# Patient Record
Sex: Female | Born: 2000 | Race: Black or African American | Hispanic: No | Marital: Single | Smoking: Former smoker
Health system: Southern US, Community
[De-identification: ages and names within clinical notes are randomized; demographics above are authoritative.]

## PROBLEM LIST (undated history)

## (undated) ENCOUNTER — Inpatient Hospital Stay (HOSPITAL_COMMUNITY): Payer: Self-pay

## (undated) DIAGNOSIS — Z789 Other specified health status: Secondary | ICD-10-CM

## (undated) DIAGNOSIS — D563 Thalassemia minor: Secondary | ICD-10-CM

## (undated) DIAGNOSIS — D573 Sickle-cell trait: Secondary | ICD-10-CM

## (undated) HISTORY — PX: NO PAST SURGERIES: SHX2092

---
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
## 2020-02-08 IMAGING — US US OB COMP LESS 14 WK
1 series · 14 of 28 positions shown · non-contrast
Comparison: None.

CLINICAL DATA: Abdominal pain for 2 and half weeks. Pregnant
patient.

EXAM:
OBSTETRIC <14 WK ULTRASOUND
TECHNIQUE: Transabdominal ultrasound was performed for evaluation of the
gestation as well as the maternal uterus and adnexal regions.

[Series 1: us ob comp less 14 wk · 14 of 53 slices shown]
[im 2/53]
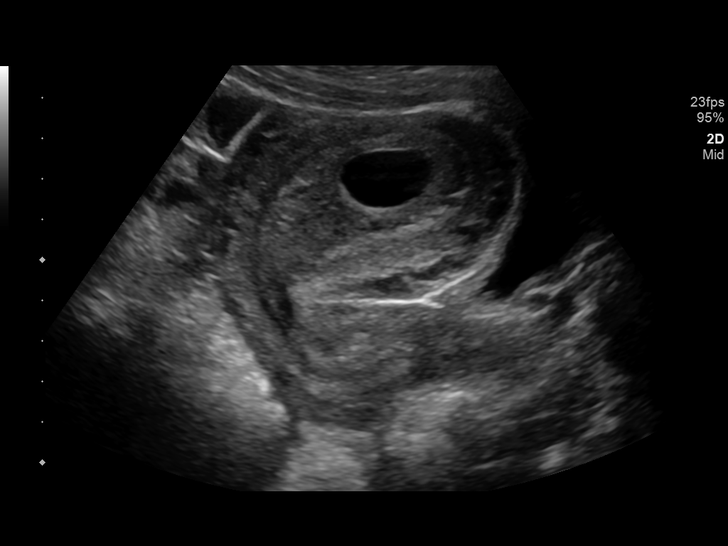
[im 6/53]
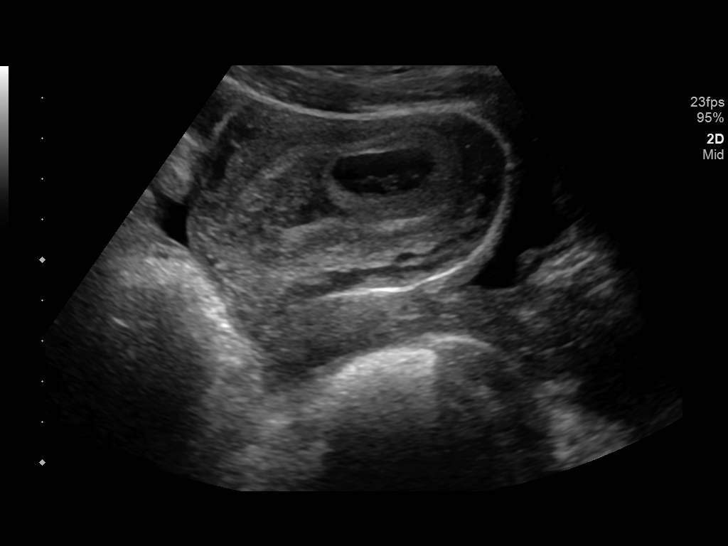
[im 10/53]
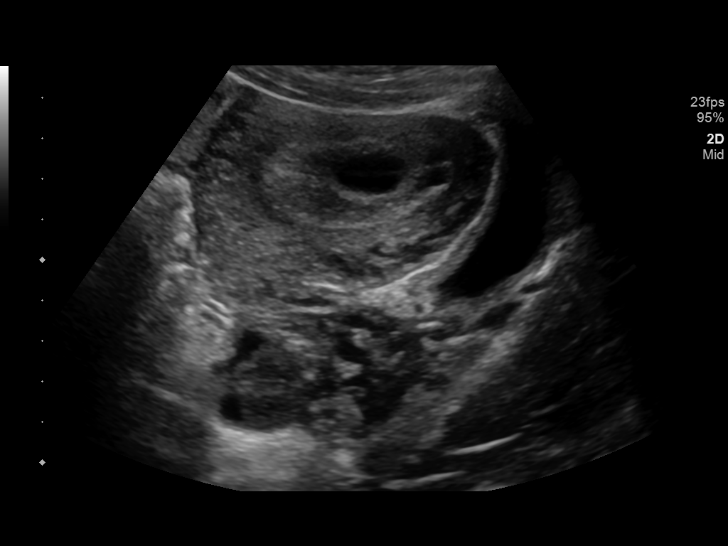
[im 14/53]
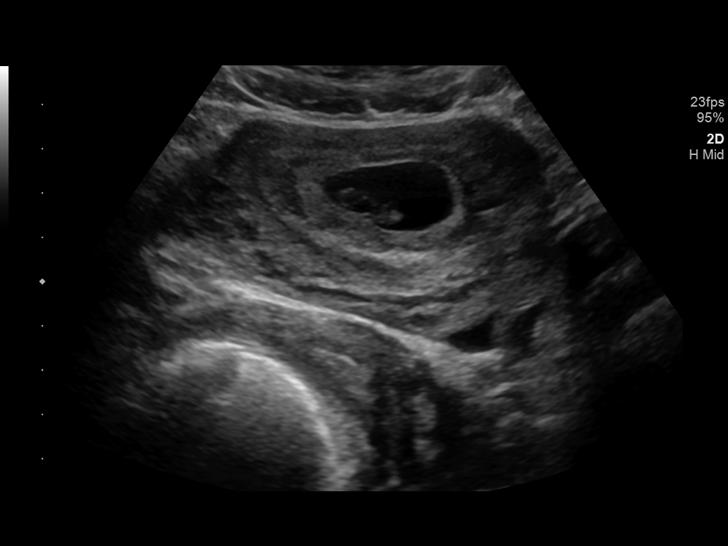
[im 18/53]
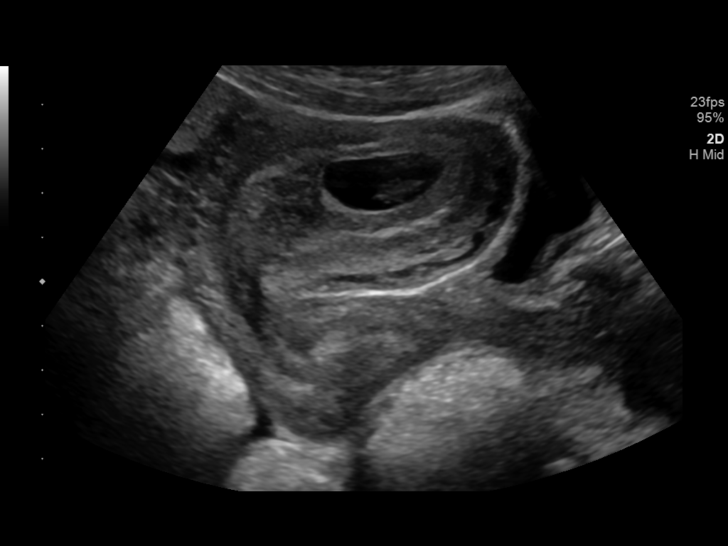
[im 22/53]
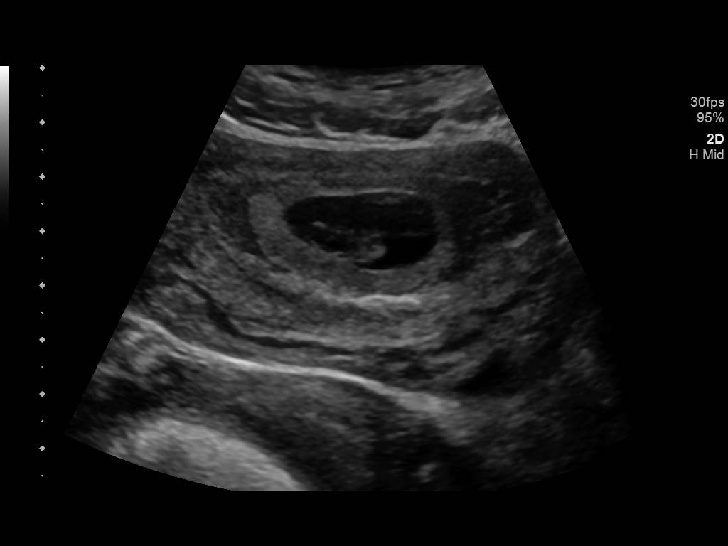
[im 26/53]
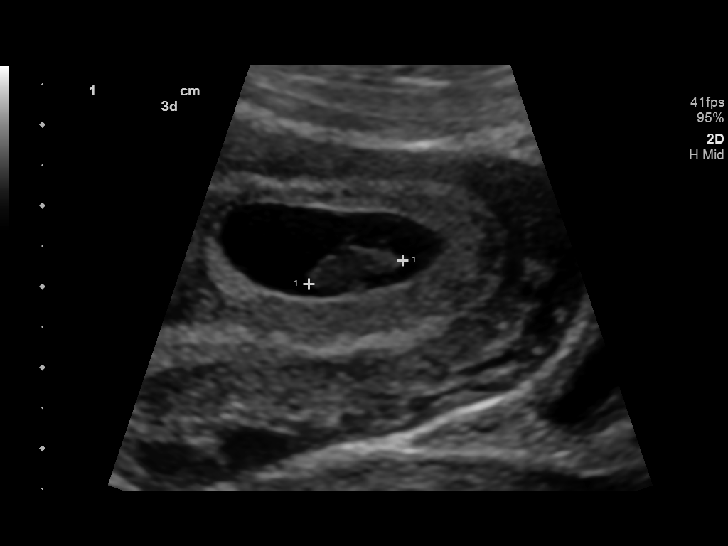
[im 29/53]
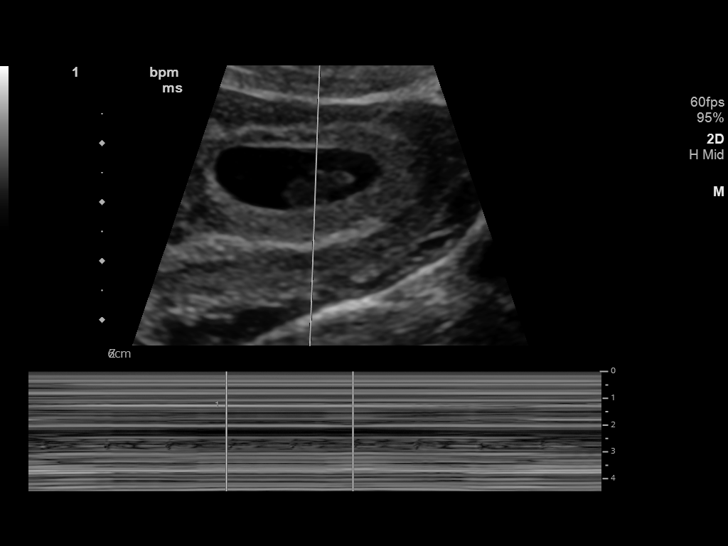
[im 33/53]
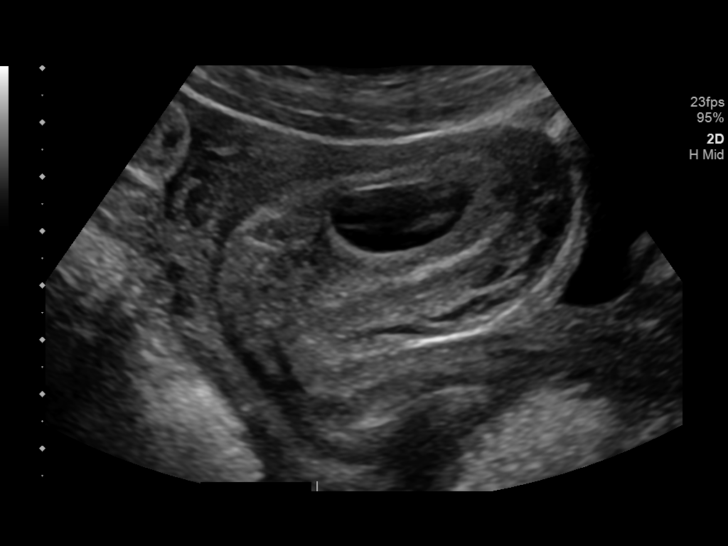
[im 37/53]
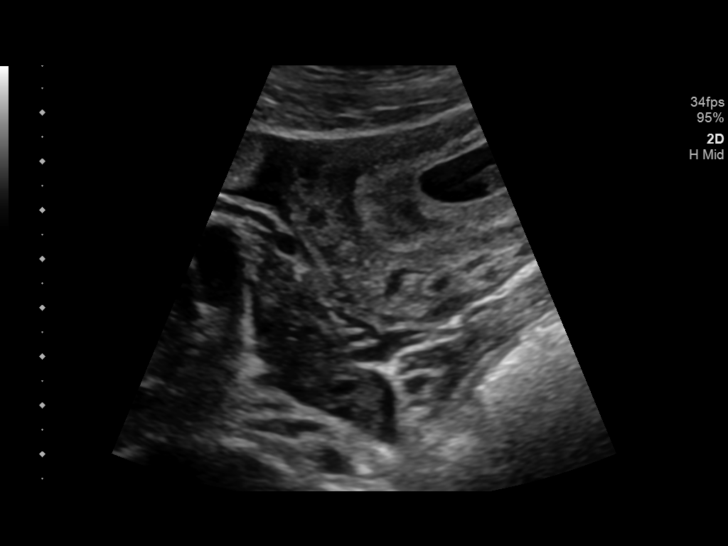
[im 41/53]
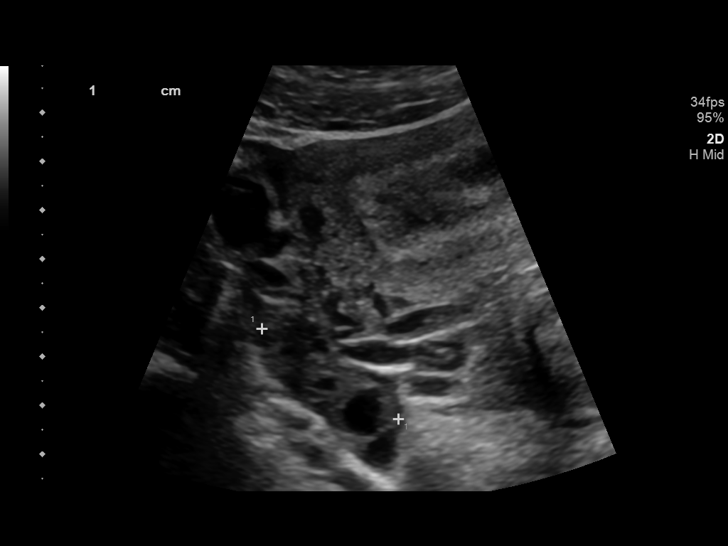
[im 45/53]
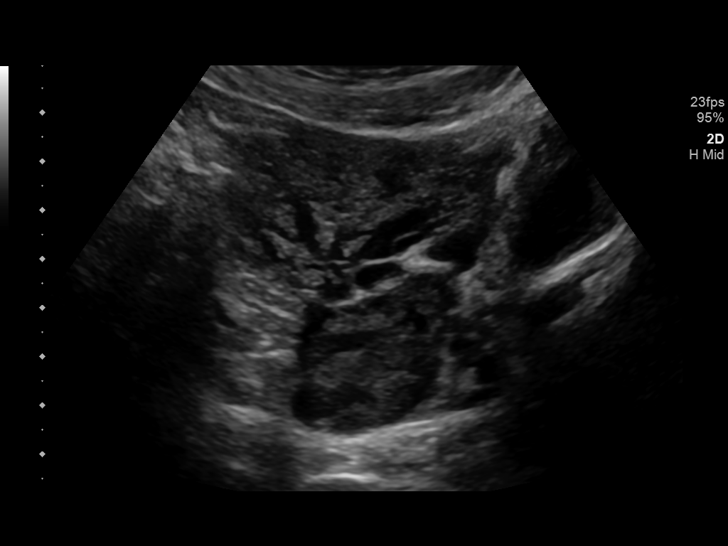
[im 49/53]
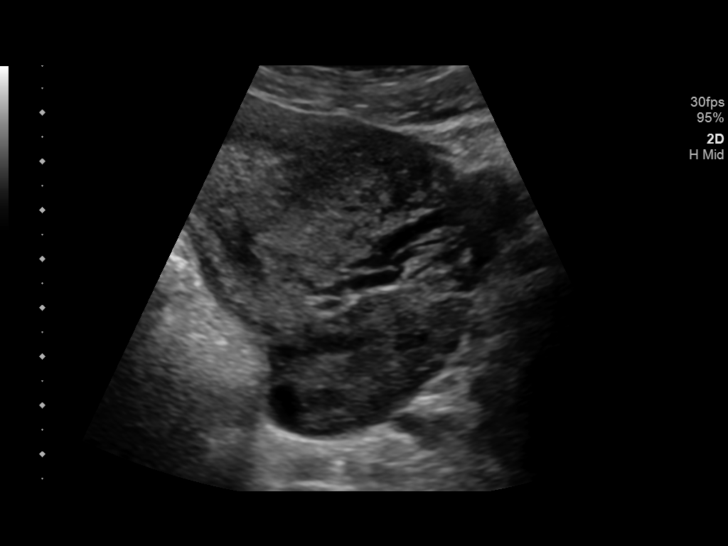
[im 53/53]
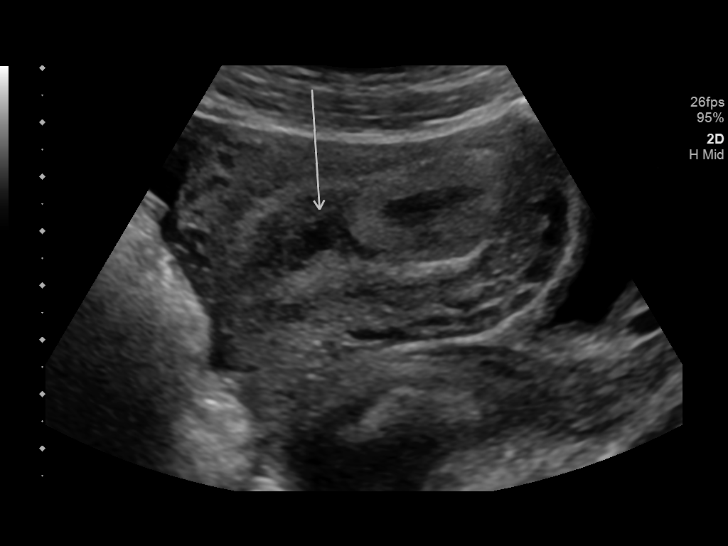

[14 of 28 positions shown; findings below may reference images not displayed]

FINDINGS: Intrauterine gestational sac: Single

Yolk sac:  Visualized.

Embryo:  Visualized.

Cardiac Activity: Visualized.

Heart Rate: 163 bpm

MSD:    mm    w     d

CRL:   12.4 mm   7 w 3 d                  US EDC: March 28, 2019

Subchorionic hemorrhage:  There is a small subchorionic hemorrhage.

Maternal uterus/adnexae: The ovaries are normal in appearance. A
small amount of physiologic fluid is seen in the pelvis.
IMPRESSION: 1. Single live IUP.  Small subchorionic hemorrhage.

## 2020-02-17 ENCOUNTER — Other Ambulatory Visit: Payer: Self-pay

## 2020-02-17 ENCOUNTER — Encounter (HOSPITAL_COMMUNITY): Payer: Self-pay | Admitting: *Deleted

## 2020-02-17 ENCOUNTER — Emergency Department (HOSPITAL_COMMUNITY)
Admission: EM | Admit: 2020-02-17 | Discharge: 2020-02-18 | Disposition: A | Discharge status: Home / Self Care | Payer: Medicaid Other | Attending: Emergency Medicine | Admitting: Emergency Medicine

## 2020-02-17 DIAGNOSIS — A599 Trichomoniasis, unspecified: Secondary | ICD-10-CM | POA: Diagnosis not present

## 2020-02-17 DIAGNOSIS — Z87891 Personal history of nicotine dependence: Secondary | ICD-10-CM | POA: Diagnosis not present

## 2020-02-17 DIAGNOSIS — N898 Other specified noninflammatory disorders of vagina: Secondary | ICD-10-CM | POA: Diagnosis present

## 2020-02-17 LAB — WET PREP, GENITAL
Clue Cells Wet Prep HPF POC: NONE SEEN
Sperm: NONE SEEN
WBC, Wet Prep HPF POC: NONE SEEN
Yeast Wet Prep HPF POC: NONE SEEN

## 2020-02-17 LAB — URINALYSIS, ROUTINE W REFLEX MICROSCOPIC
Bilirubin Urine: NEGATIVE
Glucose, UA: NEGATIVE mg/dL
Hgb urine dipstick: NEGATIVE
Ketones, ur: NEGATIVE mg/dL
Nitrite: NEGATIVE
Protein, ur: NEGATIVE mg/dL
Specific Gravity, Urine: 1.024 (ref 1.005–1.030)
WBC, UA: >50 WBC/hpf — ABNORMAL HIGH (ref 0–5)
pH: 5 (ref 5.0–8.0)

## 2020-02-17 LAB — POC URINE PREG, ED: Preg Test, Ur: NEGATIVE

## 2020-02-17 MED ORDER — METRONIDAZOLE 500 MG PO TABS: 500.0000 mg | ORAL_TABLET | Freq: Two times a day (BID) | ORAL | 0 refills | Status: DC

## 2020-02-17 MED ORDER — METRONIDAZOLE 500 MG PO TABS
500.0000 mg | ORAL_TABLET | Freq: Once | ORAL | Status: AC
  Administered 2020-02-18: 500 mg via ORAL
  Filled 2020-02-17: qty 1

## 2020-02-17 NOTE — ED Provider Notes (Signed)
[facility]-EMERGENCY DEPT Provider Note   CSN: [id number] Arrival date & time: 02/17/20  1943     History Chief Complaint  Patient presents with  . Pelvic Pain    Cathy Weber is a G49P1 19 y.o. female with a history of sickle cell trait who presents the emergency department with a chief complaint of vaginal discharge.  The patient had an uncomplicated vaginal delivery on 03/30/19.  She reports that she developed sharp intermittent pelvic pain that would travels from left to right shortly after giving birth.  Pain would last for approximately 30 minutes at a time.  She has had countless episodes of the pain since giving birth.  Pain does not radiate to her back or to her chest.  No known aggravating or alleviating factors.  She did not discuss the pain with her OB/GYN after delivery as she was still having vaginal bleeding and thought the pain was related to giving birth.  She reports that recurrent episodes of the pain have persisted for the last 10 months, but over the last month she began having malodorous vaginal discharge, yellowish/orange in color. Her ER visit today was prompted by an increase in vaginal discharge over the last few days and concerns for infection after she searched for her symptoms on the internet.   She denies fever, chills, abdominal pain, dysuria, hematuria, flank pain, rashes, urinary frequency or hesitancy, nausea, vomiting, diarrhea, or URI symptoms.  She is sexually active with 1 female partner.  They do not use condoms.  She is not on any form of birth control.  She is not breast-feeding at this time.  She has no concerns for STIs.  The history is provided by the patient. No language interpreter was used.       Past Medical History:  Diagnosis Date  . Alpha thalassemia silent carrier 11/19/2018  . Medical history non-contributory   . Sickle cell trait (HCC) 11/19/2018    Patient Active Problem List   Diagnosis Date Noted  .  Sickle cell trait (HCC) 11/19/2018  . Alpha thalassemia silent carrier 11/19/2018    Past Surgical History:  Procedure Laterality Date  . NO PAST SURGERIES       OB History    Gravida  1   Para  1   Term  1   Preterm      AB      Living  1     SAB      TAB      Ectopic      Multiple  0   Live Births  1           Family History  Problem Relation Age of Onset  . Healthy Mother   . Healthy Father     Social History   Tobacco Use  . Smoking status: Former Games developer  . Smokeless tobacco: Never Used  Vaping Use  . Vaping Use: Never used  Substance Use Topics  . Alcohol use: Not Currently  . Drug use: Not Currently    Types: Marijuana    Home Medications Prior to Admission medications   Medication Sig Start Date End Date Taking? Authorizing Provider  metroNIDAZOLE (FLAGYL) 500 MG tablet Take 1 tablet (500 mg total) by mouth 2 (two) times daily. 02/17/20   Atilla Zollner A, PA-C  Blood Pressure Monitoring (BLOOD PRESSURE CUFF) MISC 1 Device by Does not apply route once a week. For weekly monitoring of blood pressure Patient not taking: Reported on 05/04/2019  11/06/18 02/17/20  Tereso Newcomer, MD    Allergies    Patient has no known allergies.  Review of Systems   Review of Systems  Constitutional: Negative for activity change, chills and fever.  HENT: Negative for congestion and sore throat.   Respiratory: Negative for shortness of breath.   Cardiovascular: Negative for chest pain.  Gastrointestinal: Negative for abdominal pain, diarrhea, nausea and vomiting.  Genitourinary: Positive for pelvic pain and vaginal discharge. Negative for decreased urine volume, dysuria, frequency, genital sores, hematuria, vaginal bleeding and vaginal pain.  Musculoskeletal: Negative for back pain.  Skin: Negative for rash and wound.  Allergic/Immunologic: Negative for immunocompromised state.  Neurological: Negative for seizures, syncope, weakness and headaches.    Psychiatric/Behavioral: Negative for confusion.    Physical Exam Updated Vital Signs BP 118/77 (BP Location: Right Arm)   Pulse 85   Temp 97.9 F (36.6 C) (Oral)   Resp 18   Ht 5\' 10"  (1.778 m)   Wt 79.4 kg   LMP 01/30/2020   SpO2 100%   BMI 25.12 kg/m   Physical Exam Vitals and nursing note reviewed.  Constitutional:      General: She is not in acute distress.    Appearance: She is not ill-appearing, toxic-appearing or diaphoretic.     Comments: Nontoxic-appearing.  No acute distress.  HENT:     Head: Normocephalic.  Eyes:     Conjunctiva/sclera: Conjunctivae normal.  Cardiovascular:     Rate and Rhythm: Normal rate and regular rhythm.     Heart sounds: No murmur heard.  No friction rub. No gallop.   Pulmonary:     Effort: Pulmonary effort is normal. No respiratory distress.     Breath sounds: No stridor. No wheezing, rhonchi or rales.  Chest:     Chest wall: No tenderness.  Abdominal:     General: There is no distension.     Palpations: Abdomen is soft. There is no mass.     Tenderness: There is abdominal tenderness. There is no right CVA tenderness, left CVA tenderness, guarding or rebound.     Hernia: No hernia is present.     Comments: Abdomen is soft, nontender, nondistended.  Normoactive bowel sounds.  Minimal tenderness palpation in the bilateral pelvic regions without focal tenderness.  No rebound or guarding.  No tenderness over McBurney's point.  No CVA tenderness bilaterally.  Negative Murphy sign.  Genitourinary:    Comments: Chaperoned exam.  Scant yellow discharge in the vaginal vault.  Cervix is closed and she has no cervical motion tenderness.  Minimal tenderness to palpation to the right adnexa, but no masses.  Left adnexa is unremarkable. Musculoskeletal:     Cervical back: Neck supple.  Skin:    General: Skin is warm.     Findings: No rash.  Neurological:     Mental Status: She is alert.  Psychiatric:        Behavior: Behavior normal.      ED Results / Procedures / Treatments   Labs (all labs ordered are listed, but only abnormal results are displayed) Labs Reviewed  WET PREP, GENITAL - Abnormal; Notable for the following components:      Result Value   Trich, Wet Prep PRESENT (*)    All other components within normal limits  URINALYSIS, ROUTINE W REFLEX MICROSCOPIC - Abnormal; Notable for the following components:   Leukocytes,Ua LARGE (*)    WBC, UA >50 (*)    Bacteria, UA RARE (*)    All  other components within normal limits  RPR  HIV ANTIBODY (ROUTINE TESTING W REFLEX)  POC URINE PREG, ED  GC/CHLAMYDIA PROBE AMP ([place]) NOT AT [facility]    EKG None  Radiology No results found.  Procedures Procedures (including critical care time)  Medications Ordered in ED Medications  metroNIDAZOLE (FLAGYL) tablet 500 mg (500 mg Oral Given 02/18/20 0003)    ED Course  I have reviewed the triage vital signs and the nursing notes.  Pertinent labs & imaging results that were available during my care of the patient were reviewed by me and considered in my medical decision making (see chart for details).    MDM Rules/Calculators/A&P                          19 year old female with a history of sickle cell trait presenting with pelvic pain for the last 10 months and vaginal discharge that has been increasing over the last month.  No constitutional symptoms.  No urinary complaints.  Vital signs are normal.  She is nontoxic and well-appearing.  Abdominal exam is benign.  Pelvic exam is not concerning for PID.   UA with pyuria and leukocyte esterase.  I have a low suspicion for UTI given that she has no nitrates and given her symptoms, I suspect that this is related to vaginal infection.  Wet prep is positive for trichomonas.  Patient is aware that gonorrhea and Chlamydia testing is pending.  After additional education on trichomonas infection, patient is requesting testing for HIV and syphilis, which has been  ordered.  She is aware these test are also pending.  She has declined prophylactic treatment for gonorrhea and chlamydia today.  She is aware that if these tests are positive she will need to return for treatment.  She was also counseled on instructing all sexual partners that they will need to seek testing and treatment.  Outpatient resources given.  Although patient had minimal right adnexal tenderness, I have a low suspicion for TOA at this time given that patient is well-appearing.  Pelvic ultrasound is not indicated at this time.  I have a low suspicion for appendicitis, ovarian torsion, pyelonephritis, or diverticulitis.  I have advised the patient to follow-up with OB/GYN if pelvic pain does not resolve after she has completed antibiotics.  All questions answered.  ER return precautions given.  She is hemodynamically stable to no acute distress.  Safe for discharge home with outpatient follow-up as indicated.  Final Clinical Impression(s) / ED Diagnoses Final diagnoses:  Trichomoniasis    Rx / DC Orders ED Discharge Orders         Ordered    metroNIDAZOLE (FLAGYL) 500 MG tablet  2 times daily        02/17/20 2348           Anyra Kaufman A, PA-C 02/18/20 0011    Mancel Bale, MD 02/18/20 1735

## 2020-02-17 NOTE — Discharge Instructions (Signed)
Thank you for allowing me to provide your care today in the emergency department.  You were seen today for pelvic pain and vaginal discharge. You tested positive for trichomonas. This is an infection that is an STD and is transmitted through sexual intercourse.   This infection is treated with an antibiotic called metronidazole (Flagyl).  Your first dose was given tonight in the emergency department.  Take 1 tablet of metronidazole by mouth morning and night for the next week.  It is important that you complete the entire course of antibiotics.  You should also avoid alcohol while you are taking this antibiotic as it can cause severe vomiting as a side effect of the medication.  Your gonorrhea and chlamydia tests are pending.  Your syphilis and HIV test are also pending.   If any of these tests are positive, someone from the hospital will call you at the number you provided registration.  You can also download the my chart application and use the number on your discharge paperwork to register for an account.  Lab results are typically available in the app 72 hours after they have resulted.  To prevent STDs in the future, it is important that you use a condom every time you have sex.  Please remember, if you start taking birth control in the future, this can prevent pregnancy, but does not prevent sexually transmitted diseases (STDs).  [facility] recommends avoiding all sexual activities for a total of 14 days after you start treatment.      It is your responsibility to let all sexual partners know that you tested positive for trichomonas so that they can seek testing at treatment.   It is also important to note that if you were treated and have sex with someone that has not been treated that you can be reinfected.  If your pelvic pain does not resolve after you have finished treatment, please follow-up with your OB/GYN.  You can go to the [facility] department for  repeat STD testing.  You can make an appointment through their website.  Return to the emergency department if you develop significantly worsening symptoms such as fever, chills, severe pain, redness, or swelling to the penis or testicles, or if the discharge from your penis does not improve.

## 2020-02-17 NOTE — ED Triage Notes (Signed)
Pt states she has had pelvic pain since October when she had her baby. States she is also having yellow/orange discharge for about a month.

## 2020-02-18 LAB — RPR: RPR Ser Ql: NONREACTIVE

## 2020-02-18 LAB — GC/CHLAMYDIA PROBE AMP (~~LOC~~) NOT AT ARMC
Chlamydia: NEGATIVE
Comment: NEGATIVE
Comment: NORMAL
Neisseria Gonorrhea: NEGATIVE

## 2020-02-18 LAB — HIV ANTIBODY (ROUTINE TESTING W REFLEX): HIV Screen 4th Generation wRfx: NONREACTIVE

## 2020-03-07 ENCOUNTER — Encounter: Payer: Self-pay | Admitting: Radiology

## 2020-04-16 ENCOUNTER — Encounter (HOSPITAL_COMMUNITY): Payer: Self-pay | Admitting: Emergency Medicine

## 2020-04-16 ENCOUNTER — Emergency Department (HOSPITAL_COMMUNITY)
Admission: EM | Admit: 2020-04-16 | Discharge: 2020-04-16 | Disposition: A | Discharge status: Home / Self Care | Payer: Medicaid Other | Attending: Emergency Medicine | Admitting: Emergency Medicine

## 2020-04-16 ENCOUNTER — Other Ambulatory Visit: Payer: Self-pay

## 2020-04-16 DIAGNOSIS — Z87891 Personal history of nicotine dependence: Secondary | ICD-10-CM | POA: Diagnosis not present

## 2020-04-16 DIAGNOSIS — B9689 Other specified bacterial agents as the cause of diseases classified elsewhere: Secondary | ICD-10-CM | POA: Insufficient documentation

## 2020-04-16 DIAGNOSIS — Z202 Contact with and (suspected) exposure to infections with a predominantly sexual mode of transmission: Secondary | ICD-10-CM | POA: Insufficient documentation

## 2020-04-16 DIAGNOSIS — N760 Acute vaginitis: Secondary | ICD-10-CM | POA: Insufficient documentation

## 2020-04-16 LAB — WET PREP, GENITAL
Sperm: NONE SEEN
Trich, Wet Prep: NONE SEEN
Yeast Wet Prep HPF POC: NONE SEEN

## 2020-04-16 LAB — PREGNANCY, URINE: Preg Test, Ur: NEGATIVE

## 2020-04-16 MED ORDER — METRONIDAZOLE 500 MG PO TABS: 500.0000 mg | ORAL_TABLET | Freq: Two times a day (BID) | ORAL | 0 refills | Status: DC

## 2020-04-16 NOTE — ED Provider Notes (Signed)
[facility]   CSN: [id number] Arrival date & time: 04/16/20  1646     History Chief Complaint  Patient presents with  . Exposure to STD    Cathy Weber is a 19 y.o. female.  19 year old female presents to emergency room with request for STD testing.  Patient states that she tested positive for trichomoniasis 2 months ago, states her partner was also treated at that time.  Patient states that she had unprotected intercourse with the same person recently and he noticed blood in his ejaculate which has her concerned for possible STD today. Denies abdominal or pelvic pain, unusual discharge or any other complaints or concerns. Patient denies possibility of pregnancy although does not use any form of birth control.         Past Medical History:  Diagnosis Date  . Alpha thalassemia silent carrier 11/19/2018  . Medical history non-contributory   . Sickle cell trait (HCC) 11/19/2018    Patient Active Problem List   Diagnosis Date Noted  . Sickle cell trait (HCC) 11/19/2018  . Alpha thalassemia silent carrier 11/19/2018    Past Surgical History:  Procedure Laterality Date  . NO PAST SURGERIES       OB History    Gravida  1   Para  1   Term  1   Preterm      AB      Living  1     SAB      TAB      Ectopic      Multiple  0   Live Births  1           Family History  Problem Relation Age of Onset  . Healthy Mother   . Healthy Father     Social History   Tobacco Use  . Smoking status: Former Games developer  . Smokeless tobacco: Never Used  Vaping Use  . Vaping Use: Never used  Substance Use Topics  . Alcohol use: Not Currently  . Drug use: Not Currently    Types: Marijuana    Home Medications Prior to Admission medications   Medication Sig Start Date End Date Taking? Authorizing Provider  metroNIDAZOLE (FLAGYL) 500 MG tablet Take 1 tablet (500 mg total) by mouth 2 (two) times daily. 04/16/20    Jeannie Fend, PA-C  Blood Pressure Monitoring (BLOOD PRESSURE CUFF) MISC 1 Device by Does not apply route once a week. For weekly monitoring of blood pressure Patient not taking: Reported on 05/04/2019 11/06/18 02/17/20  Tereso Newcomer, MD    Allergies    Patient has no known allergies.  Review of Systems   Review of Systems  Constitutional: Negative for fever.  Gastrointestinal: Negative for abdominal pain.  Genitourinary: Negative for pelvic pain, vaginal bleeding and vaginal discharge.  Musculoskeletal: Negative for back pain.  Skin: Negative for rash and wound.  Allergic/Immunologic: Negative for immunocompromised state.    Physical Exam Updated Vital Signs BP 113/64 (BP Location: Left Arm)   Pulse 80   Temp 97.8 F (36.6 C) (Oral)   Resp 16   Ht 5' 10.5" (1.791 m)   Wt 78.9 kg   SpO2 100%   BMI 24.61 kg/m   Physical Exam Vitals and nursing note reviewed.  Constitutional:      General: She is not in acute distress.    Appearance: She is well-developed. She is not diaphoretic.  HENT:     Head: Normocephalic and atraumatic.  Cardiovascular:  Rate and Rhythm: Normal rate and regular rhythm.     Pulses: Normal pulses.     Heart sounds: Normal heart sounds.  Pulmonary:     Effort: Pulmonary effort is normal.     Breath sounds: Normal breath sounds.  Abdominal:     Palpations: Abdomen is soft.     Tenderness: There is no abdominal tenderness. There is no right CVA tenderness or left CVA tenderness.  Skin:    General: Skin is warm and dry.  Neurological:     Mental Status: She is alert and oriented to person, place, and time.  Psychiatric:        Behavior: Behavior normal.     ED Results / Procedures / Treatments   Labs (all labs ordered are listed, but only abnormal results are displayed) Labs Reviewed  WET PREP, GENITAL - Abnormal; Notable for the following components:      Result Value   Clue Cells Wet Prep HPF POC PRESENT (*)    WBC, Wet Prep HPF  POC RARE (*)    All other components within normal limits  PREGNANCY, URINE  GC/CHLAMYDIA PROBE AMP ([place]) NOT AT [facility]    EKG None  Radiology No results found.  Procedures Procedures (including critical care time)  Medications Ordered in ED Medications - No data to display  ED Course  I have reviewed the triage vital signs and the nursing notes.  Pertinent labs & imaging results that were available during my care of the patient were reviewed by me and considered in my medical decision making (see chart for details).  Clinical Course as of Apr 16 1937  Sat Apr 16, 2020  [id number] 19 year old female with request for STD testing, no symptoms or concerns otherwise. Exam unremarkable. Plan is to obtain self swab, sent upreg and urine gc/chlamydia. Recommend follow up with outpatient testing centers (referral list given) for further testing and follow up.    [LM]  1938 Wet prep is positive for clue cells, patient states that the discharge she had when she had trichomoniasis never totally resolved, will treat this with Flagyl. Still recommend follow-up with clinic for additional STD testing as discussed.   [LM]    Clinical Course User Index [LM] Alden Hipp   MDM Rules/Calculators/A&P                          Final Clinical Impression(s) / ED Diagnoses Final diagnoses:  Possible exposure to STD  Bacterial vaginosis    Rx / DC Orders ED Discharge Orders         Ordered    metroNIDAZOLE (FLAGYL) 500 MG tablet  2 times daily        04/16/20 1935           Alden Hipp 04/16/20 1938    Little, Ambrose Finland, MD 04/17/20 1306

## 2020-04-16 NOTE — ED Triage Notes (Addendum)
Patient states she was having unprotected sex and requesting STD check. Denies symptoms at this time. Hx trich.

## 2020-04-16 NOTE — Discharge Instructions (Addendum)
There are many options for quick evaluation and management of certain GYN issues that do not require a long wait time or big bill from the emergency department.   Consider these options for your care in the future:   Walk-ins for certain complaints available at:   [facility] [address]  [phone]  See hours at [email or web address]   [facility]  [address]  [phone]   [facility]  [address]  [phone]   You can make an appointment to see a GYN provider:   [facility]  [address]  [phone]   [facility] [place]  [address]  [phone]   [facility] [place]  [address]  [phone]   [facility] [place]  [address], [address]  [phone]   If you already have an established OB/GYN provider in the area you can make an appointment with them as well.  

## 2020-04-18 LAB — GC/CHLAMYDIA PROBE AMP (~~LOC~~) NOT AT ARMC
Chlamydia: NEGATIVE
Comment: NEGATIVE
Comment: NORMAL
Neisseria Gonorrhea: NEGATIVE

## 2020-05-09 IMAGING — US US MFM OB COMP + 14 WK
1 series · 13 of 28 positions shown · non-contrast
Comparison: none

[Series 1: us mfm ob comp + 14 wk · 95 acquisitions, 13 frames shown]
[im 4/95]
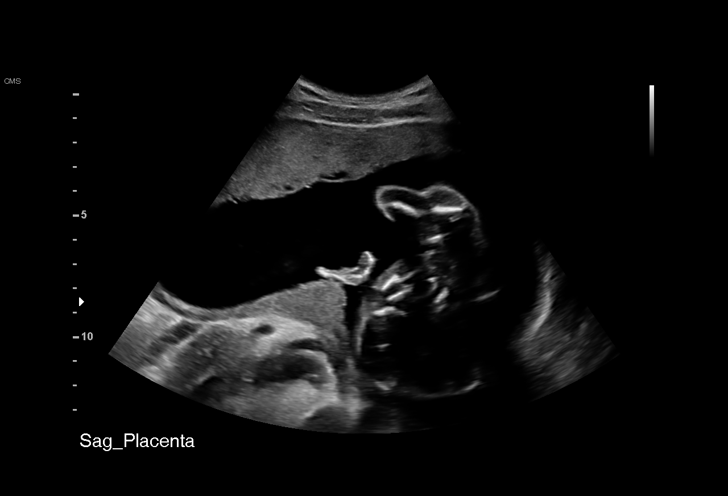
[im 11/95]
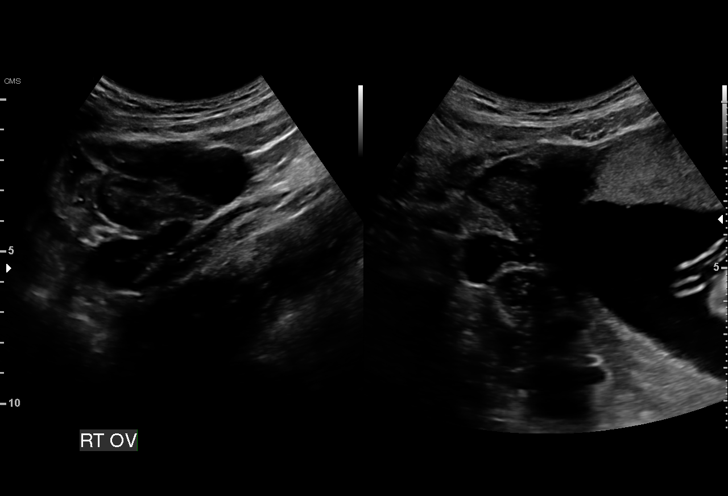
[im 18/95]
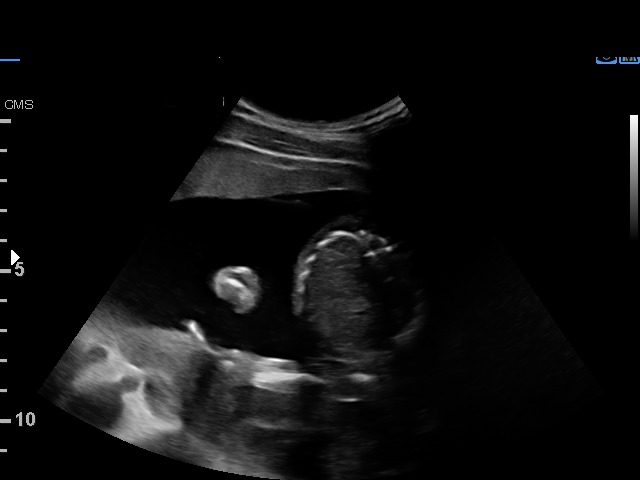
[im 25/95]
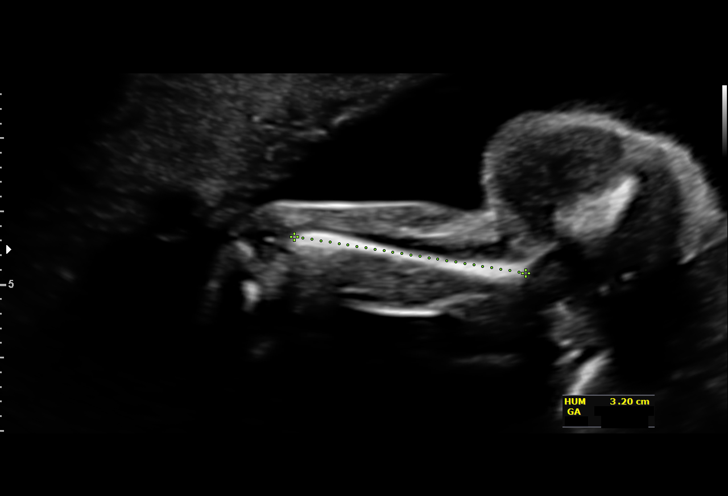
[im 32/95]
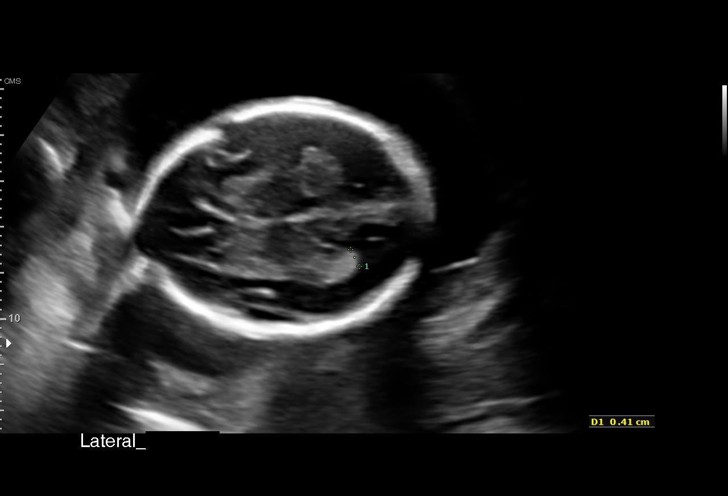
[im 39/95]
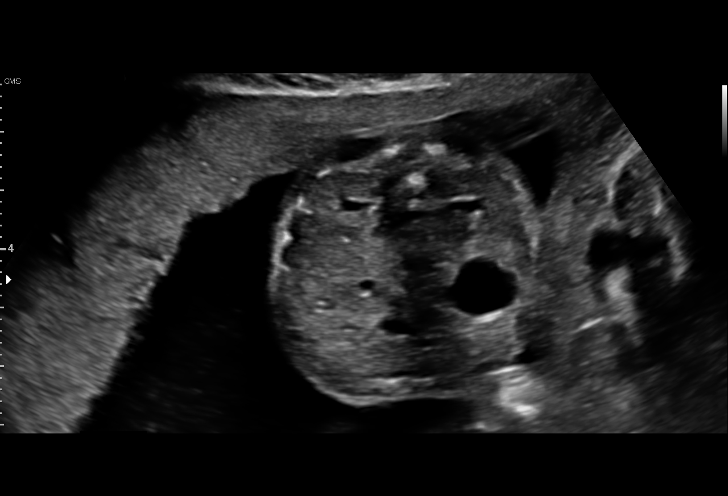
[im 49/95]
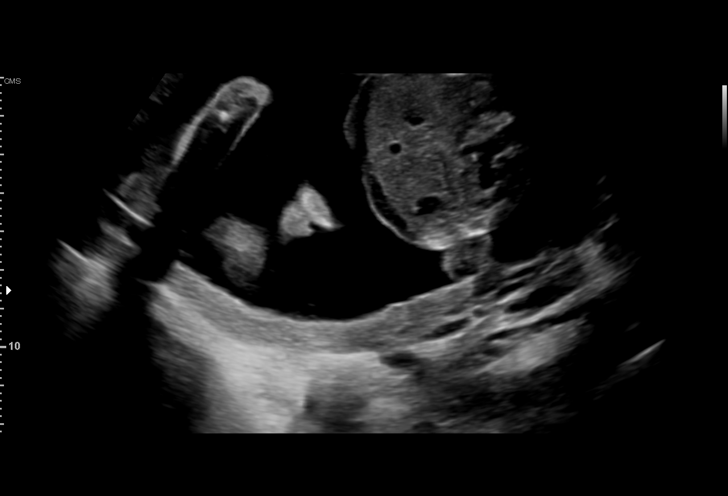
[im 56/95]
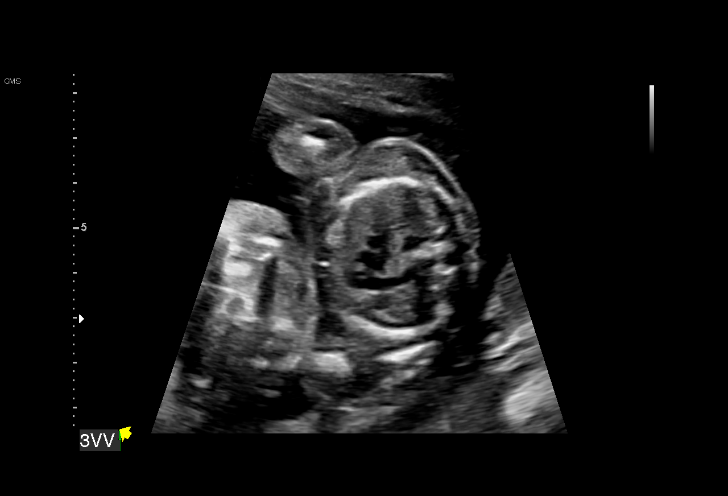
[im 63/95]
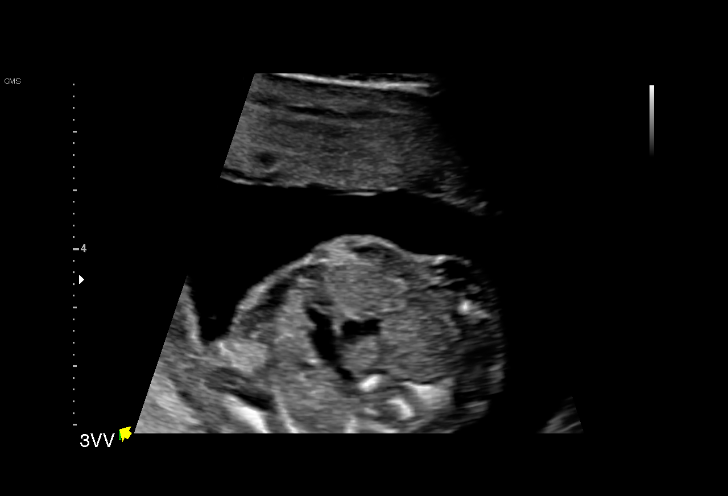
[im 70/95]
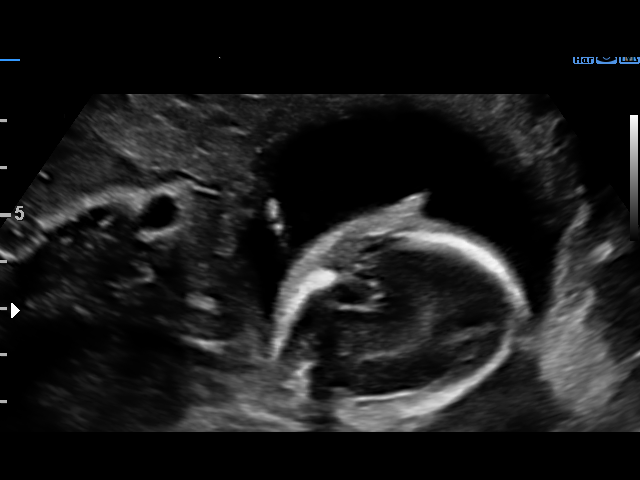
[im 77/95]
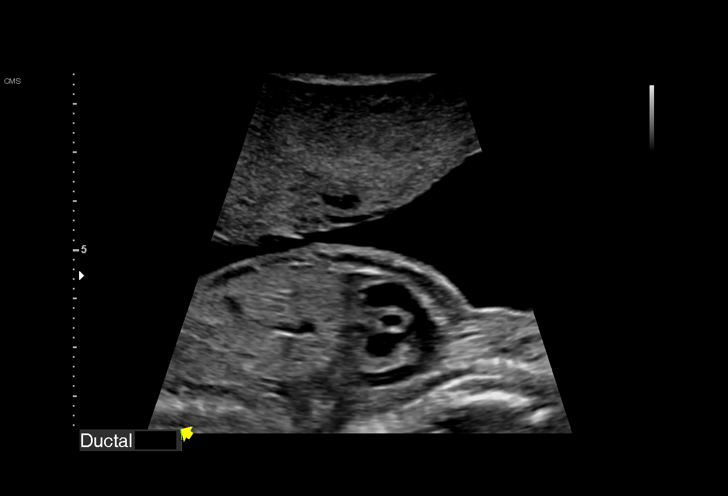
[im 84/95]
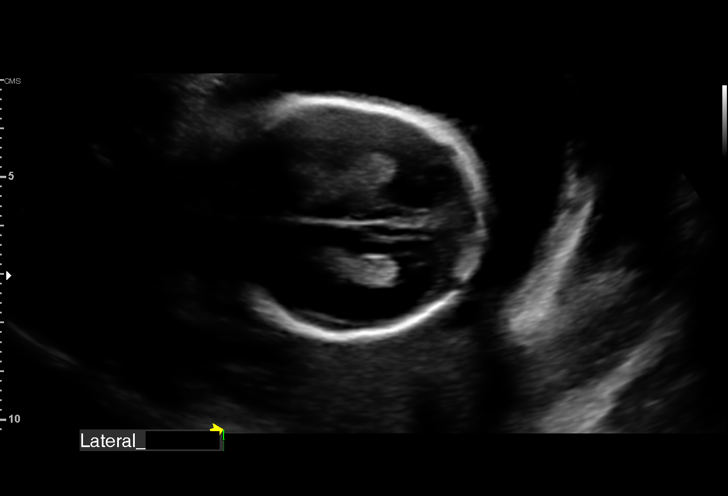
[im 91/95]
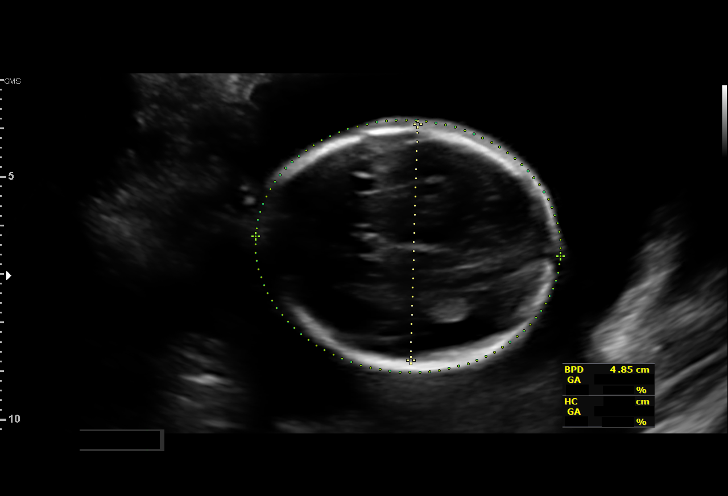

[13 of 28 positions shown; findings below may reference images not displayed]

1  US MFM OB COMP + 14 WK               76805.01     LUIX GIRO
 ----------------------------------------------------------------------

 ----------------------------------------------------------------------
Indications

  Encounter for antenatal screening for
  malformations
  20 weeks gestation of pregnancy
 ----------------------------------------------------------------------
Fetal Evaluation

 Num Of Fetuses:         1
 Fetal Heart Rate(bpm):  158
 Cardiac Activity:       Observed
 Presentation:           Variable
 Placenta:               Anterior
 P. Cord Insertion:      Visualized

 Amniotic Fluid
 AFI FV:      Within normal limits

                             Largest Pocket(cm)

Biometry

 BPD:      48.5  mm     G. Age:  20w 5d         60  %    CI:        72.76   %    70 - 86
                                                         FL/HC:      17.5   %    16.8 -
 HC:      180.8  mm     G. Age:  20w 3d         45  %    HC/AC:      1.22        1.09 -
 AC:      148.5  mm     G. Age:  20w 1d         34  %    FL/BPD:     65.2   %
 FL:       31.6  mm     G. Age:  19w 6d         22  %    FL/AC:      21.3   %    20 - 24
 HUM:      32.3  mm     G. Age:  20w 6d         61  %
 CER:      20.7  mm     G. Age:  19w 5d         34  %

 CM:        5.2  mm

 Est. FW:     329  gm    0 lb 12 oz      39  %
OB History
 Gravidity:    1         Term:   0        Prem:   0        SAB:   0
 TOP:          0       Ectopic:  0        Living: 0
Gestational Age

 LMP:           21w 6d        Date:  06/11/18                 EDD:   03/18/19
 U/S Today:     20w 2d                                        EDD:   03/29/19
 Best:          20w 3d     Det. By:  Early Ultrasound         EDD:   03/28/19
                                     (08/12/18)
Anatomy

 Cranium:               Appears normal         Aortic Arch:            Appears normal
 Cavum:                 Appears normal         Ductal Arch:            Appears normal
 Ventricles:            Appears normal         Diaphragm:              Appears normal
 Choroid Plexus:        Appears normal         Stomach:                Appears normal, left
                                                                       sided
 Cerebellum:            Appears normal         Abdomen:                Appears normal
 Posterior Fossa:       Appears normal         Abdominal Wall:         Appears nml (cord
                                                                       insert, abd wall)
 Nuchal Fold:           Appears normal         Cord Vessels:           Appears normal (3
                                                                       vessel cord)
 Face:                  Appears normal         Kidneys:                Appear normal
                        (orbits and profile)
 Lips:                  Appears normal         Bladder:                Appears normal
 Thoracic:              Appears normal         Spine:                  Appears normal
 Heart:                 Appears normal         Upper Extremities:      Appears normal
                        (4CH, axis, and
                        situs)
 RVOT:                  Appears normal         Lower Extremities:      Appears normal
 LVOT:                  Appears normal

 Other:  Heels and 5th digit visualized. Nasal bone visualized.
Cervix Uterus Adnexa

 Cervix
 Length:            3.4  cm.
 Normal appearance by transabdominal scan.

 Uterus
 No abnormality visualized.

 Left Ovary
 Not visualized.

 Right Ovary
 Within normal limits.

 Cul De Sac
 No free fluid seen.

 Adnexa
 No abnormality visualized.
Impression

 We performed fetal anatomy scan. No makers of
 aneuploidies or fetal structural defects are seen. Fetal
 biometry is consistent with her previously-established dates.
 Amniotic fluid is normal and good fetal activity is seen.
 Cell-free fetal DNA screening result is pending.
Recommendations

 Follow-up as clinically indicated.
                 Erxleben, [place]

## 2020-05-18 ENCOUNTER — Emergency Department (HOSPITAL_COMMUNITY)
Admission: EM | Admit: 2020-05-18 | Discharge: 2020-05-18 | Disposition: A | Discharge status: Home / Self Care | Payer: Medicaid Other | Attending: Emergency Medicine | Admitting: Emergency Medicine

## 2020-05-18 ENCOUNTER — Encounter (HOSPITAL_COMMUNITY): Payer: Self-pay

## 2020-05-18 DIAGNOSIS — N3000 Acute cystitis without hematuria: Secondary | ICD-10-CM | POA: Diagnosis not present

## 2020-05-18 DIAGNOSIS — R102 Pelvic and perineal pain: Secondary | ICD-10-CM | POA: Diagnosis present

## 2020-05-18 DIAGNOSIS — Z87891 Personal history of nicotine dependence: Secondary | ICD-10-CM | POA: Insufficient documentation

## 2020-05-18 LAB — URINALYSIS, ROUTINE W REFLEX MICROSCOPIC
Bilirubin Urine: NEGATIVE
Glucose, UA: NEGATIVE mg/dL
Hgb urine dipstick: NEGATIVE
Ketones, ur: NEGATIVE mg/dL
Nitrite: NEGATIVE
Protein, ur: NEGATIVE mg/dL
Specific Gravity, Urine: 1.018 (ref 1.005–1.030)
pH: 7 (ref 5.0–8.0)

## 2020-05-18 LAB — PREGNANCY, URINE: Preg Test, Ur: NEGATIVE

## 2020-05-18 MED ORDER — CEPHALEXIN 500 MG PO CAPS: 500.0000 mg | ORAL_CAPSULE | Freq: Four times a day (QID) | ORAL | 0 refills | Status: DC

## 2020-05-18 NOTE — ED Notes (Signed)
RN assumed care at this time.

## 2020-05-18 NOTE — ED Triage Notes (Signed)
Pt arrived via walk in, c/o pelvic pain x6 days and vaginal bleeding since last night . States vaginal bleeding started with spotting last night, bleeding increased today. States she changed tampon x1, no excessive bleeding since. States she took a plan B earlier this week and that is when these issues first started.

## 2020-05-19 NOTE — ED Provider Notes (Signed)
[facility]   CSN: [id number] Arrival date & time: 05/18/20  1413     History Chief Complaint  Patient presents with   Vaginal Bleeding   Pelvic Pain    Cathy Weber is a 19 y.o. female.  HPI Patient with pelvic pain and vaginal bleeding.  States started after she took Plan B earlier this week.  States mild spotting and then increased bleeding today.  States she has to use 1 tampon.  No vaginal discharge.  States she is worried she she could have a pregnancy despite the plan B.  No dysuria.  No fevers or chills.    Past Medical History:  Diagnosis Date   Alpha thalassemia silent carrier 11/19/2018   Medical history non-contributory    Sickle cell trait (HCC) 11/19/2018    Patient Active Problem List   Diagnosis Date Noted   Sickle cell trait (HCC) 11/19/2018   Alpha thalassemia silent carrier 11/19/2018    Past Surgical History:  Procedure Laterality Date   NO PAST SURGERIES       OB History    Gravida  1   Para  1   Term  1   Preterm      AB      Living  1     SAB      TAB      Ectopic      Multiple  0   Live Births  1           Family History  Problem Relation Age of Onset   Healthy Mother    Healthy Father     Social History   Tobacco Use   Smoking status: Former Smoker   Smokeless tobacco: Never Used  Building services engineer Use: Never used  Substance Use Topics   Alcohol use: Not Currently   Drug use: Not Currently    Types: Marijuana    Home Medications Prior to Admission medications   Medication Sig Start Date End Date Taking? Authorizing Provider  cephALEXin (KEFLEX) 500 MG capsule Take 1 capsule (500 mg total) by mouth 4 (four) times daily. 05/18/20   Benjiman Core, MD  metroNIDAZOLE (FLAGYL) 500 MG tablet Take 1 tablet (500 mg total) by mouth 2 (two) times daily. 04/16/20   Jeannie Fend, PA-C  Blood Pressure Monitoring (BLOOD PRESSURE CUFF) MISC  1 Device by Does not apply route once a week. For weekly monitoring of blood pressure Patient not taking: Reported on 05/04/2019 11/06/18 02/17/20  Tereso Newcomer, MD    Allergies    Patient has no known allergies.  Review of Systems   Review of Systems  Constitutional: Negative for appetite change.  Respiratory: Negative for shortness of breath.   Gastrointestinal: Negative for abdominal pain.  Genitourinary: Positive for dysuria and vaginal bleeding. Negative for vaginal discharge and vaginal pain.  Musculoskeletal: Negative for back pain.  Skin: Negative for rash.  Neurological: Negative for weakness.  Psychiatric/Behavioral: Negative for confusion.    Physical Exam Updated Vital Signs BP 99/81    Pulse 61    Temp 98.9 F (37.2 C) (Oral)    Resp 18    SpO2 100%   Physical Exam Vitals and nursing note reviewed.  HENT:     Head: Normocephalic.  Eyes:     Extraocular Movements: Extraocular movements intact.     Pupils: Pupils are equal, round, and reactive to light.  Cardiovascular:     Rate and Rhythm:  Normal rate and regular rhythm.  Pulmonary:     Breath sounds: No wheezing or rhonchi.  Abdominal:     Tenderness: There is no abdominal tenderness.  Genitourinary:    Comments: Patient deferred pelvic exam. Musculoskeletal:        General: No tenderness. Normal range of motion.  Skin:    General: Skin is warm.     Capillary Refill: Capillary refill takes less than 2 seconds.  Neurological:     Mental Status: She is alert and oriented to person, place, and time.  Psychiatric:        Mood and Affect: Mood normal.     ED Results / Procedures / Treatments   Labs (all labs ordered are listed, but only abnormal results are displayed) Labs Reviewed  URINALYSIS, ROUTINE W REFLEX MICROSCOPIC - Abnormal; Notable for the following components:      Result Value   APPearance HAZY (*)    Leukocytes,Ua TRACE (*)    Bacteria, UA MANY (*)    All other components within  normal limits  URINE CULTURE  PREGNANCY, URINE    EKG None  Radiology No results found.  Procedures Procedures (including critical care time)  Medications Ordered in ED Medications - No data to display  ED Course  I have reviewed the triage vital signs and the nursing notes.  Pertinent labs & imaging results that were available during my care of the patient were reviewed by me and considered in my medical decision making (see chart for details).    MDM Rules/Calculators/A&P                          Patient with pelvic pain and slight bleeding after taking Plan B.  Also some dysuria.  Not pregnant.  Urine does show likely infection.  With the dysuria will treat.  Doubt severe intra-abdominal or intrapelvic infection.  Patient deferred rectal exam.  Will only treat for UTI.  Patient does not want evaluation for STD. Final Clinical Impression(s) / ED Diagnoses Final diagnoses:  Acute cystitis without hematuria    Rx / DC Orders ED Discharge Orders         Ordered    cephALEXin (KEFLEX) 500 MG capsule  4 times daily        05/18/20 1616           Benjiman Core, MD 05/19/20 [phone]

## 2020-05-20 ENCOUNTER — Telehealth (HOSPITAL_BASED_OUTPATIENT_CLINIC_OR_DEPARTMENT_OTHER): Payer: Self-pay | Admitting: Emergency Medicine

## 2020-05-21 LAB — URINE CULTURE: Culture: >=100000 — AB

## 2020-05-22 ENCOUNTER — Telehealth (HOSPITAL_BASED_OUTPATIENT_CLINIC_OR_DEPARTMENT_OTHER): Payer: Self-pay | Admitting: Emergency Medicine

## 2020-05-22 NOTE — Telephone Encounter (Signed)
Post ED Visit - Positive Culture Follow-up: Unsuccessful Patient Follow-up  Culture assessed and recommendations reviewed by:  []  , Pharm.D. []  Enzo Bi, Pharm.D., BCPS AQ-ID []  , Pharm.D., BCPS []  Celedonio Miyamoto, Pharm.D., BCPS [x]  , Pharm.D., BCPS, AAHIVP []  Garvin Fila, Pharm.D., BCPS, AAHIVP []  , PharmD []  Georgina Pillion, PharmD, BCPS  Positive urine culture  []  Patient discharged without antimicrobial prescription and treatment is now indicated [x]  Organism is resistant to prescribed ED discharge antimicrobial []  Patient with positive blood cultures   Unable to contact patient at phone number on file, letter will be sent to address on file  Plan: Stop Keflex, start Macrobid 100 mg, BID x five days - [facility]  Dorna Leitz 05/22/2020, 5:02 PM

## 2020-05-22 NOTE — Progress Notes (Signed)
ED Antimicrobial Stewardship Positive Culture Follow Up   Cathy Weber is an 19 y.o. female who presented to [facility] on 05/18/2020 with a chief complaint of possible pregnancy despite plan B and dysuria discharged on cephalexin for possible UTI. Urine culture resulted with S saprophyticus, oxacillin resistant. Cephalosporins not on card but not reliable coverage. True infection vs contaminant vs colonization.   Chief Complaint  Patient presents with  . Vaginal Bleeding  . Pelvic Pain    Recent Results (from the past 720 hour(s))  Urine culture     Status: Abnormal   Collection Time: 05/18/20  4:13 PM   Specimen: Urine, Clean Catch  Result Value Ref Range Status   Specimen Description   Final    URINE, CLEAN CATCH Performed at [facility], [address], [place], [place] [address]    Special Requests   Final    NONE Performed at [facility], [address], [place], [place] [address]    Culture (A)  Final    >=100,000 COLONIES/mL STAPHYLOCOCCUS SAPROPHYTICUS CORRECTED RESULTS PREVIOUSLY REPORTED AS: >=100,000 COLONIES/mL GRAM NEGATIVE RODS CORRECTED RESULTS CALLED TO: MARVA SIMMS RN @1158  05/20/20 EB Performed at [facility], [address], [place], [place]    Report Status 05/21/2020 FINAL  Final   Organism ID, Bacteria STAPHYLOCOCCUS SAPROPHYTICUS (A)  Final      Susceptibility   Staphylococcus saprophyticus - MIC*    CIPROFLOXACIN <=0.5 SENSITIVE Sensitive     GENTAMICIN <=0.5 SENSITIVE Sensitive     NITROFURANTOIN <=16 SENSITIVE Sensitive     OXACILLIN 2 RESISTANT Resistant     TETRACYCLINE <=1 SENSITIVE Sensitive     VANCOMYCIN 1 SENSITIVE Sensitive     TRIMETH/SULFA <=10 SENSITIVE Sensitive     CLINDAMYCIN <=0.25 SENSITIVE Sensitive     RIFAMPIN <=0.5 SENSITIVE Sensitive     Inducible Clindamycin NEGATIVE Sensitive     * >=100,000 COLONIES/mL STAPHYLOCOCCUS SAPROPHYTICUS    Call patient to determine  if symptomatic. If asymptomatic, no change. If symptomatic, will call in Macrobid 100 mg bid x 5 days and instruct patient to d/c Keflex. Attempted to call patient. Mobile phone is not a working mother. Called patient's mother to ask for new number or call back, but mother refused to talk.   ED Provider: 05/23/2020 D 05/22/2020, 1:15 PM

## 2020-07-14 ENCOUNTER — Ambulatory Visit: Payer: Medicaid Other | Admitting: Family Medicine

## 2020-07-17 ENCOUNTER — Encounter (HOSPITAL_COMMUNITY): Payer: Self-pay | Admitting: Emergency Medicine

## 2020-07-17 ENCOUNTER — Emergency Department (HOSPITAL_COMMUNITY)
Admission: EM | Admit: 2020-07-17 | Discharge: 2020-07-18 | Disposition: A | Discharge status: Home / Self Care | Payer: Medicaid Other | Attending: Emergency Medicine | Admitting: Emergency Medicine

## 2020-07-17 ENCOUNTER — Other Ambulatory Visit: Payer: Self-pay

## 2020-07-17 DIAGNOSIS — N938 Other specified abnormal uterine and vaginal bleeding: Secondary | ICD-10-CM | POA: Diagnosis present

## 2020-07-17 DIAGNOSIS — Z87891 Personal history of nicotine dependence: Secondary | ICD-10-CM | POA: Insufficient documentation

## 2020-07-17 LAB — CBC WITH DIFFERENTIAL/PLATELET
Abs Immature Granulocytes: 0.01 10*3/uL (ref 0.00–0.07)
Basophils Absolute: 0 10*3/uL (ref 0.0–0.1)
Basophils Relative: 1 %
Eosinophils Absolute: 0.1 10*3/uL (ref 0.0–0.5)
Eosinophils Relative: 1 %
HCT: 37.2 % (ref 36.0–46.0)
Hemoglobin: 12.6 g/dL (ref 12.0–15.0)
Immature Granulocytes: 0 %
Lymphocytes Relative: 50 %
Lymphs Abs: 2.4 10*3/uL (ref 0.7–4.0)
MCH: 27.9 pg (ref 26.0–34.0)
MCHC: 33.9 g/dL (ref 30.0–36.0)
MCV: 82.5 fL (ref 80.0–100.0)
Monocytes Absolute: 0.5 10*3/uL (ref 0.1–1.0)
Monocytes Relative: 10 %
Neutro Abs: 1.8 10*3/uL (ref 1.7–7.7)
Neutrophils Relative %: 38 %
Platelets: 277 10*3/uL (ref 150–400)
RBC: 4.51 MIL/uL (ref 3.87–5.11)
RDW: 14.1 % (ref 11.5–15.5)
WBC: 4.8 10*3/uL (ref 4.0–10.5)
nRBC: 0 % (ref 0.0–0.2)

## 2020-07-17 LAB — I-STAT BETA HCG BLOOD, ED (MC, WL, AP ONLY): I-stat hCG, quantitative: <5 m[IU]/mL (ref <5)

## 2020-07-17 LAB — COMPREHENSIVE METABOLIC PANEL
ALT: 15 U/L (ref 0–44)
AST: 15 U/L (ref 15–41)
Albumin: 4.3 g/dL (ref 3.5–5.0)
Alkaline Phosphatase: 60 U/L (ref 38–126)
Anion gap: 8 (ref 5–15)
BUN: 10 mg/dL (ref 6–20)
CO2: 24 mmol/L (ref 22–32)
Calcium: 9.3 mg/dL (ref 8.9–10.3)
Chloride: 109 mmol/L (ref 98–111)
Creatinine, Ser: 0.9 mg/dL (ref 0.44–1.00)
GFR, Estimated: >60 mL/min (ref >60)
Glucose, Bld: 87 mg/dL (ref 70–99)
Potassium: 3.7 mmol/L (ref 3.5–5.1)
Sodium: 141 mmol/L (ref 135–145)
Total Bilirubin: 0.6 mg/dL (ref 0.3–1.2)
Total Protein: 7.4 g/dL (ref 6.5–8.1)

## 2020-07-17 LAB — WET PREP, GENITAL
Sperm: NONE SEEN
Trich, Wet Prep: NONE SEEN
Yeast Wet Prep HPF POC: NONE SEEN

## 2020-07-17 NOTE — ED Notes (Signed)
Pt has 2 gold tops and 1 blue top in lab if needed

## 2020-07-17 NOTE — ED Triage Notes (Signed)
Patient states that her cycle started on the 18th and was supposed to end yesterday, but is still having heavy bleeding with moderate clots. Patient states during her birth in 2020, she had issues with retained placenta which was surgically removed. Patient states since then her bleeding is irregular and heavy. Patient states she is soaking through 1 pad an hour. Endorses some nausea.

## 2020-07-17 NOTE — ED Provider Notes (Signed)
Cathy Weber Provider Note   CSN: [id number] Arrival date & time: 07/17/20  1944     History Chief Complaint  Patient presents with  . Vaginal Bleeding    Cathy Weber is a 20 y.o. female.  The history is provided by the patient and medical records. No language interpreter was used.  Vaginal Bleeding    20 year old female G1, P1 presenting for evaluation of heavy menstruation.  Patient reports since she gave birth in 2020, her menstruation has been irregular.  States during her giving birth, she was having trouble with retained placenta causing complications.  Her menstruation has been irregular.  Her menstruation usually last for about 4 to 5 days however during this cycle, it has been ongoing for the past 6 days.  She endorsed having heavy vaginal bleeding with associated clots.  She is going through approximately a pad an hour.  She felt that this is heavier than normal which concerns her.  She does endorse some mild nausea but denies any significant abdominal pain.  She denies lightheadedness or dizziness no dysuria or vaginal discharge or rash.  She denies any new sexual partner.  Past Medical History:  Diagnosis Date  . Alpha thalassemia silent carrier 11/19/2018  . Medical history non-contributory   . Sickle cell trait (HCC) 11/19/2018    Patient Active Problem List   Diagnosis Date Noted  . Sickle cell trait (HCC) 11/19/2018  . Alpha thalassemia silent carrier 11/19/2018    Past Surgical History:  Procedure Laterality Date  . NO PAST SURGERIES       OB History    Gravida  1   Para  1   Term  1   Preterm      AB      Living  1     SAB      IAB      Ectopic      Multiple  0   Live Births  1           Family History  Problem Relation Age of Onset  . Healthy Mother   . Healthy Father     Social History   Tobacco Use  . Smoking status: Former Games developer  . Smokeless tobacco: Never Used  Vaping Use  .  Vaping Use: Never used  Substance Use Topics  . Alcohol use: Not Currently  . Drug use: Not Currently    Types: Marijuana    Home Medications Prior to Admission medications   Medication Sig Start Date End Date Taking? Authorizing Provider  cephALEXin (KEFLEX) 500 MG capsule Take 1 capsule (500 mg total) by mouth 4 (four) times daily. 05/18/20   Benjiman Core, MD  metroNIDAZOLE (FLAGYL) 500 MG tablet Take 1 tablet (500 mg total) by mouth 2 (two) times daily. 04/16/20   Jeannie Fend, PA-C  Blood Pressure Monitoring (BLOOD PRESSURE CUFF) MISC 1 Device by Does not apply route once a week. For weekly monitoring of blood pressure Patient not taking: Reported on 05/04/2019 11/06/18 02/17/20  Tereso Newcomer, MD    Allergies    Patient has no known allergies.  Review of Systems   Review of Systems  Genitourinary: Positive for vaginal bleeding.  All other systems reviewed and are negative.   Physical Exam Updated Vital Signs BP 107/75   Pulse 73   Temp 98.2 F (36.8 C)   Resp 18   SpO2 100%   Physical Exam Vitals and nursing note reviewed.  Constitutional:  General: She is not in acute distress.    Appearance: She is well-developed and well-nourished.  HENT:     Head: Atraumatic.  Eyes:     Conjunctiva/sclera: Conjunctivae normal.  Cardiovascular:     Rate and Rhythm: Normal rate and regular rhythm.     Pulses: Normal pulses.     Heart sounds: Normal heart sounds.  Pulmonary:     Effort: Pulmonary effort is normal.     Breath sounds: Normal breath sounds.  Abdominal:     General: Abdomen is flat.     Palpations: Abdomen is soft.     Tenderness: There is no abdominal tenderness.  Musculoskeletal:     Cervical back: Neck supple.  Skin:    Findings: No rash.  Neurological:     Mental Status: She is alert.  Psychiatric:        Mood and Affect: Mood and affect and mood normal.     ED Results / Procedures / Treatments   Labs (all labs ordered are listed,  but only abnormal results are displayed) Labs Reviewed  WET PREP, GENITAL - Abnormal; Notable for the following components:      Result Value   Clue Cells Wet Prep HPF POC PRESENT (*)    WBC, Wet Prep HPF POC FEW (*)    All other components within normal limits  CBC WITH DIFFERENTIAL/PLATELET  COMPREHENSIVE METABOLIC PANEL  I-STAT BETA HCG BLOOD, ED ([facility], [facility], [facility] ONLY)  GC/CHLAMYDIA PROBE AMP ([facility]) NOT AT [facility]    EKG None  Radiology No results found.  Procedures Pelvic exam  Date/Time: 07/17/2020 11:12 PM Performed by: Cathy Helper, PA-C Authorized by: Cathy Helper, PA-C  Consent: Verbal consent obtained. Risks and benefits: risks, benefits and alternatives were discussed Consent given by: patient Patient understanding: patient states understanding of the procedure being performed Patient consent: the patient's understanding of the procedure matches consent given Procedure consent: procedure consent matches procedure scheduled Patient identity confirmed: verbally with patient and arm band Local anesthesia used: no  Anesthesia: Local anesthesia used: no  Sedation: Patient sedated: no  Comments: Cathy Weber, NT was available to chaperone.  No inguinal lymphadenopathy or inguinal hernia noted.  Normal external genitalia.  Mild discomfort with speculum insertion.  Small amount of blood noted in vaginal vault.  Cervical os visualized and is closed.  No dystrophic skin changes.  And no obvious vaginal discharge noted.  On bimanual examination no adnexal tenderness or cervical motion tenderness.    (including critical care time)  Medications Ordered in ED Medications - No data to display  ED Course  I have reviewed the triage vital signs and the nursing notes.  Pertinent labs & imaging results that were available during my care of the patient were reviewed by me and considered in my medical decision making (see chart for details).    MDM Rules/Calculators/A&P                           BP 107/75   Pulse 73   Temp 98.2 F (36.8 C)   Resp 18   SpO2 100%   Final Clinical Impression(s) / ED Diagnoses Final diagnoses:  Dysfunctional uterine bleeding    Rx / DC Orders ED Discharge Orders    None     11:13 PM Patient presents complaining of prolonged menstruation with persistent bleeding and clots which concerns her.  She has 1 day of her usual duration of her menstruation.  She  has a fairly benign abdominal exam.  Her labs are reassuring, no anemia, pregnancy test is negative, Pelvic exam performed by me without concerning for PID.  12:43 AM Wet prep with some evidence of clue cells but symptoms not consistent with BV.  Pregnancy test is negative, hemoglobin is normal at 12.6, electrolyte panels are reassuring.  At this time patient is stable for discharge with outpatient follow-up with OB/GYN for further care.  Return precaution given.   Cathy Helper, PA-C 07/18/20 0046    Lorre Nick, MD 07/22/20 [phone]

## 2020-07-17 NOTE — ED Provider Notes (Incomplete)
[facility] Provider Note   CSN: [id number] Arrival date & time: 07/17/20  1944     History Chief Complaint  Patient presents with  . Vaginal Bleeding    Cathy Weber is a 20 y.o. female.  The history is provided by the patient and medical records. No language interpreter was used.  Vaginal Bleeding    20 year old female G1, P1 presenting for evaluation of heavy menstruation.  Patient reports since she gave birth in 2020, her menstruation has been irregular.  States during her giving birth, she was having trouble with retained placenta causing complications.  Her menstruation has been irregular.  Her menstruation usually last for about 4 to 5 days however during this cycle, it has been ongoing for the past 6 days.  She endorsed having heavy vaginal bleeding with associated clots.  She is going through approximately a pad an hour.  She felt that this is heavier than normal which concerns her.  She does endorse some mild nausea but denies any significant abdominal pain.  She denies lightheadedness or dizziness no dysuria or vaginal discharge or rash.  She denies any new sexual partner.  Past Medical History:  Diagnosis Date  . Alpha thalassemia silent carrier 11/19/2018  . Medical history non-contributory   . Sickle cell trait (HCC) 11/19/2018    Patient Active Problem List   Diagnosis Date Noted  . Sickle cell trait (HCC) 11/19/2018  . Alpha thalassemia silent carrier 11/19/2018    Past Surgical History:  Procedure Laterality Date  . NO PAST SURGERIES       OB History    Gravida  1   Para  1   Term  1   Preterm      AB      Living  1     SAB      IAB      Ectopic      Multiple  0   Live Births  1           Family History  Problem Relation Age of Onset  . Healthy Mother   . Healthy Father     Social History   Tobacco Use  . Smoking status: Former Games developer  . Smokeless tobacco: Never Used  Vaping Use  .  Vaping Use: Never used  Substance Use Topics  . Alcohol use: Not Currently  . Drug use: Not Currently    Types: Marijuana    Home Medications Prior to Admission medications   Medication Sig Start Date End Date Taking? Authorizing Provider  cephALEXin (KEFLEX) 500 MG capsule Take 1 capsule (500 mg total) by mouth 4 (four) times daily. 05/18/20   Benjiman Core, MD  metroNIDAZOLE (FLAGYL) 500 MG tablet Take 1 tablet (500 mg total) by mouth 2 (two) times daily. 04/16/20   Jeannie Fend, PA-C  Blood Pressure Monitoring (BLOOD PRESSURE CUFF) MISC 1 Device by Does not apply route once a week. For weekly monitoring of blood pressure Patient not taking: Reported on 05/04/2019 11/06/18 02/17/20  Tereso Newcomer, MD    Allergies    Patient has no known allergies.  Review of Systems   Review of Systems  Genitourinary: Positive for vaginal bleeding.  All other systems reviewed and are negative.   Physical Exam Updated Vital Signs BP 107/75   Pulse 73   Temp 98.2 F (36.8 C)   Resp 18   SpO2 100%   Physical Exam Vitals and nursing note reviewed.  Constitutional:  General: She is not in acute distress.    Appearance: She is well-developed and well-nourished.  HENT:     Head: Atraumatic.  Eyes:     Conjunctiva/sclera: Conjunctivae normal.  Cardiovascular:     Rate and Rhythm: Normal rate and regular rhythm.     Pulses: Normal pulses.     Heart sounds: Normal heart sounds.  Pulmonary:     Effort: Pulmonary effort is normal.     Breath sounds: Normal breath sounds.  Abdominal:     General: Abdomen is flat.     Palpations: Abdomen is soft.     Tenderness: There is no abdominal tenderness.  Musculoskeletal:     Cervical back: Neck supple.  Skin:    Findings: No rash.  Neurological:     Mental Status: She is alert.  Psychiatric:        Mood and Affect: Mood and affect and mood normal.     ED Results / Procedures / Treatments   Labs (all labs ordered are listed,  but only abnormal results are displayed) Labs Reviewed  CBC WITH DIFFERENTIAL/PLATELET  COMPREHENSIVE METABOLIC PANEL  I-STAT BETA HCG BLOOD, ED ([facility], [facility], [facility] ONLY)    EKG None  Radiology No results found.  Procedures Pelvic exam  Date/Time: 07/17/2020 11:12 PM Performed by: Fayrene Helper, PA-C Authorized by: Fayrene Helper, PA-C  Consent: Verbal consent obtained. Risks and benefits: risks, benefits and alternatives were discussed Consent given by: patient Patient understanding: patient states understanding of the procedure being performed Patient consent: the patient's understanding of the procedure matches consent given Procedure consent: procedure consent matches procedure scheduled Patient identity confirmed: verbally with patient and arm band Local anesthesia used: no  Anesthesia: Local anesthesia used: no  Sedation: Patient sedated: no  Comments: Viri, NT was available to chaperone.  No inguinal lymphadenopathy or inguinal hernia noted.  Normal external genitalia.  Mild discomfort with speculum insertion.  Small amount of blood noted in vaginal vault.  Cervical os visualized and is closed.  No dystrophic skin changes.  And no obvious vaginal discharge noted.  On bimanual examination no adnexal tenderness or cervical motion tenderness.    (including critical care time)  Medications Ordered in ED Medications - No data to display  ED Course  I have reviewed the triage vital signs and the nursing notes.  Pertinent labs & imaging results that were available during my care of the patient were reviewed by me and considered in my medical decision making (see chart for details).    MDM Rules/Calculators/A&P                          *** Final Clinical Impression(s) / ED Diagnoses Final diagnoses:  None    Rx / DC Orders ED Discharge Orders    None     11:13 PM Patient presents complaining of prolonged menstruation with persistent bleeding and clots which concerns  her.  She has 1 day of her usual duration of her menstruation.  She has a fairly benign abdominal exam.  Her labs are reassuring, no anemia, pregnancy test is negative, Pap exam performed by me without concerning for PID.

## 2020-07-18 LAB — GC/CHLAMYDIA PROBE AMP (~~LOC~~) NOT AT ARMC
Chlamydia: NEGATIVE
Comment: NEGATIVE
Comment: NORMAL
Neisseria Gonorrhea: NEGATIVE

## 2020-10-12 ENCOUNTER — Encounter (HOSPITAL_COMMUNITY): Payer: Self-pay | Admitting: Emergency Medicine

## 2020-10-12 ENCOUNTER — Other Ambulatory Visit: Payer: Self-pay

## 2020-10-12 ENCOUNTER — Emergency Department (HOSPITAL_COMMUNITY)
Admission: EM | Admit: 2020-10-12 | Discharge: 2020-10-12 | Disposition: A | Discharge status: Home / Self Care | Payer: Medicaid Other | Attending: Emergency Medicine | Admitting: Emergency Medicine

## 2020-10-12 DIAGNOSIS — Z3201 Encounter for pregnancy test, result positive: Secondary | ICD-10-CM | POA: Diagnosis not present

## 2020-10-12 DIAGNOSIS — F1210 Cannabis abuse, uncomplicated: Secondary | ICD-10-CM | POA: Diagnosis not present

## 2020-10-12 DIAGNOSIS — N898 Other specified noninflammatory disorders of vagina: Secondary | ICD-10-CM

## 2020-10-12 DIAGNOSIS — Z87891 Personal history of nicotine dependence: Secondary | ICD-10-CM | POA: Diagnosis not present

## 2020-10-12 LAB — URINALYSIS, ROUTINE W REFLEX MICROSCOPIC
Bilirubin Urine: NEGATIVE
Glucose, UA: NEGATIVE mg/dL
Hgb urine dipstick: NEGATIVE
Ketones, ur: NEGATIVE mg/dL
Leukocytes,Ua: NEGATIVE
Nitrite: NEGATIVE
Protein, ur: NEGATIVE mg/dL
Specific Gravity, Urine: 1.013 (ref 1.005–1.030)
pH: 5 (ref 5.0–8.0)

## 2020-10-12 LAB — I-STAT BETA HCG BLOOD, ED (MC, WL, AP ONLY): I-stat hCG, quantitative: 36.4 m[IU]/mL — ABNORMAL HIGH (ref <5)

## 2020-10-12 LAB — WET PREP, GENITAL
Clue Cells Wet Prep HPF POC: NONE SEEN
Sperm: NONE SEEN
Trich, Wet Prep: NONE SEEN
WBC, Wet Prep HPF POC: NONE SEEN
Yeast Wet Prep HPF POC: NONE SEEN

## 2020-10-12 MED ORDER — PRENATAL VITAMIN 27-0.8 MG PO TABS: ORAL_TABLET | ORAL | 3 refills | Status: DC

## 2020-10-12 NOTE — ED Triage Notes (Signed)
Emergency Medicine Provider Triage Evaluation Note  Ori Kreiter , a 20 y.o. female  was evaluated in triage.  Pt complains of vaginal discharge that started about 1 week ago. Denies pelvic pain or fevers.  Review of Systems  Positive: Vaginal discharge Negative: Fever, abd pain  Physical Exam  BP 111/70   Pulse 83   Temp 98.2 F (36.8 C) (Oral)   Resp 18   Ht 5\' 10"  (1.778 m)   Wt 78.9 kg   SpO2 100%   BMI 24.97 kg/m  Gen:   Awake, no distress   HEENT:  Atraumatic  Resp:  Normal effort  Cardiac:  Normal rate  Abd:   Nondistended,  MSK:   Moves extremities without difficulty  Neuro:  Speech clear   Medical Decision Making  Medically screening exam initiated at 8:33 PM.  Appropriate orders placed.  Lisa Milian was informed that the remainder of the evaluation will be completed by another provider, this initial triage assessment does not replace that evaluation, and the importance of remaining in the ED until their evaluation is complete.  Clinical Impression   MSE was initiated and I personally evaluated the patient and placed orders (if any) at  8:33 PM on October 12, 2020.  The patient appears stable so that the remainder of the MSE may be completed by another provider.    October 14, 2020, Karrie Meres 10/12/20 2033

## 2020-10-12 NOTE — ED Provider Notes (Signed)
[facility]-EMERGENCY DEPT Provider Note   CSN: [id number] Arrival date & time: 10/12/20  2003    History Chief Complaint  Patient presents with  . Vaginal Discharge  . Urinary Frequency    Cathy Weber is a 20 y.o. female with no significant medical history who presents for evaluation of vaginal discharge.  Has had thin, clear vaginal discharge over the last week.  She is due for her menstrual cycle.  She has no associated pelvic pain or abdominal pain.  She has no vaginal bleeding.  She is currently sexually active.  She does not use birth control.  States she will intermittently use a Plan B if needed for pregnancy protection.  Last sexual activity 3 days ago.  Sexually active female partner.  She denies fever, chills, nausea vomiting, chest pain, shortness breath abdominal pain, pelvic pain, paresthesias or weakness.  No dysuria, hematuria, urinary frequency.  No flank pain. Denies additional aggravating or alleviating factors  History obtained from patient and past medical records.  No interpreter used  HPI     Past Medical History:  Diagnosis Date  . Alpha thalassemia silent carrier 11/19/2018  . Medical history non-contributory   . Sickle cell trait (HCC) 11/19/2018    Patient Active Problem List   Diagnosis Date Noted  . Sickle cell trait (HCC) 11/19/2018  . Alpha thalassemia silent carrier 11/19/2018    Past Surgical History:  Procedure Laterality Date  . NO PAST SURGERIES       OB History    Gravida  1   Para  1   Term  1   Preterm      AB      Living  1     SAB      IAB      Ectopic      Multiple  0   Live Births  1           Family History  Problem Relation Age of Onset  . Healthy Mother   . Healthy Father     Social History   Tobacco Use  . Smoking status: Former Games developer  . Smokeless tobacco: Never Used  Vaping Use  . Vaping Use: Never used  Substance Use Topics  . Alcohol use: Not Currently  . Drug  use: Not Currently    Types: Marijuana    Home Medications Prior to Admission medications   Medication Sig Start Date End Date Taking? Authorizing Provider  Prenatal Vit-Fe Fumarate-FA (PRENATAL VITAMIN) 27-0.8 MG TABS Take 1 tablet daily 10/12/20  Yes Aarika Moon A, PA-C  Blood Pressure Monitoring (BLOOD PRESSURE CUFF) MISC 1 Device by Does not apply route once a week. For weekly monitoring of blood pressure Patient not taking: Reported on 05/04/2019 11/06/18 02/17/20  Tereso Newcomer, MD    Allergies    Patient has no known allergies.  Review of Systems   Review of Systems  Constitutional: Negative.   HENT: Negative.   Respiratory: Negative.   Cardiovascular: Negative.   Gastrointestinal: Negative.   Genitourinary: Positive for vaginal discharge. Negative for decreased urine volume, difficulty urinating, dysuria, flank pain, frequency, hematuria, menstrual problem, pelvic pain, urgency, vaginal bleeding and vaginal pain.  Musculoskeletal: Negative.   Skin: Negative.   Neurological: Negative.   All other systems reviewed and are negative.   Physical Exam Updated Vital Signs BP 118/74 (BP Location: Left Arm)   Pulse 69   Temp 98.2 F (36.8 C) (Oral)   Resp 16  Ht 5\' 10"  (1.778 m)   Wt 78.9 kg   SpO2 100%   BMI 24.97 kg/m   Physical Exam Vitals and nursing note reviewed.  Constitutional:      General: She is not in acute distress.    Appearance: She is well-developed. She is not ill-appearing, toxic-appearing or diaphoretic.  HENT:     Head: Normocephalic and atraumatic.     Nose: Nose normal.     Mouth/Throat:     Mouth: Mucous membranes are moist.  Eyes:     Pupils: Pupils are equal, round, and reactive to light.  Cardiovascular:     Rate and Rhythm: Normal rate.     Pulses: Normal pulses.     Heart sounds: Normal heart sounds.  Pulmonary:     Effort: Pulmonary effort is normal. No respiratory distress.     Breath sounds: Normal breath sounds.   Abdominal:     General: Bowel sounds are normal. There is no distension.     Tenderness: There is no abdominal tenderness. There is no right CVA tenderness, left CVA tenderness, guarding or rebound.  Genitourinary:    Comments: Normal appearing external female genitalia without rashes or lesions, normal vaginal epithelium. Normal appearing cervix without discharge or petechiae. Cervical os is closed. There is no bleeding noted at the os. No odor. Bimanual: No CMT,  nontender.  No palpable adnexal masses or tenderness. Uterus midline and not fixed. Rectovaginal exam was deferred.  No cystocele or rectocele noted. No pelvic lymphadenopathy noted. Wet prep was obtained.  Cultures for gonorrhea and chlamydia collected. Exam performed with chaperone in room. Musculoskeletal:        General: No swelling or tenderness. Normal range of motion.     Cervical back: Normal range of motion.  Skin:    General: Skin is warm and dry.     Capillary Refill: Capillary refill takes less than 2 seconds.  Neurological:     General: No focal deficit present.     Mental Status: She is alert and oriented to person, place, and time.     ED Results / Procedures / Treatments   Labs (all labs ordered are listed, but only abnormal results are displayed) Labs Reviewed  I-STAT BETA HCG BLOOD, ED ([facility], [facility], [facility] ONLY) - Abnormal; Notable for the following components:      Result Value   I-stat hCG, quantitative 36.4 (*)    All other components within normal limits  WET PREP, GENITAL  URINALYSIS, ROUTINE W REFLEX MICROSCOPIC  HIV ANTIBODY (ROUTINE TESTING W REFLEX)  RPR  GC/CHLAMYDIA PROBE AMP ([place]) NOT AT [facility]    EKG None  Radiology No results found.  Procedures Procedures   Medications Ordered in ED Medications - No data to display  ED Course  I have reviewed the triage vital signs and the nursing notes.  Pertinent labs & imaging results that were available during my care of the patient were  reviewed by me and considered in my medical decision making (see chart for details).  Patient here for evaluation of vaginal discharge for the last week.  He is afebrile, nonseptic, not ill-appearing.  Abdomen soft, nontender.  She denies any pelvic pain, vaginal discharge.  She is currently sexually active.  Is any nausea or vomiting.  Her heart and lungs are clear.  Abdomen soft, nontender.  GU exam with out any discharge.  No CMT or adnexal tenderness.  Low suspicion for PID.  UA negative for infection.  Patient's pregnancy  test did come back positive.  States she is due for her menstrual cycle in the next few days.  Again she denies any abdominal pain, pelvic pain or vaginal discharge.  I have low suspicion for ectopic pregnancy.  Discussed starting on prenatal vitamin close follow-up with OB/GYN.  She is agreeable for this.  With regards to vaginal discharge.  She does not have any discharge on exam. No tenderness. Wet prep without significant findings.  GC, chlamydia obtained.  Discussed strict return precautions with patient.  She is agreeable.  The patient has been appropriately medically screened and/or stabilized in the ED. I have low suspicion for any other emergent medical condition which would require further screening, evaluation or treatment in the ED or require inpatient management.  Patient is hemodynamically stable and in no acute distress.  Patient able to ambulate in department prior to ED.  Evaluation does not show acute pathology that would require ongoing or additional emergent interventions while in the emergency department or further inpatient treatment.  I have discussed the diagnosis with the patient and answered all questions.  Pain is been managed while in the emergency department and patient has no further complaints prior to discharge.  Patient is comfortable with plan discussed in room and is stable for discharge at this time.  I have discussed strict return precautions  for returning to the emergency department.  Patient was encouraged to follow-up with PCP/specialist refer to at discharge.    MDM Rules/Calculators/A&P                           Final Clinical Impression(s) / ED Diagnoses Final diagnoses:  Vaginal discharge  Positive pregnancy test    Rx / DC Orders ED Discharge Orders         Ordered    Prenatal Vit-Fe Fumarate-FA (PRENATAL VITAMIN) 27-0.8 MG TABS        10/12/20 2252           Jaythen Hamme A, PA-C 10/12/20 2336    Arby Barrette, MD 10/25/20 1536

## 2020-10-12 NOTE — ED Triage Notes (Signed)
Patient presents with white vaginal discharge she has had for about a week and a half. She believes it is from a recent sexual contact. She also reports frequency of urination.

## 2020-10-12 NOTE — ED Notes (Signed)
Pt providing urine sample.

## 2020-10-12 NOTE — Discharge Instructions (Signed)
Your pregnancy test here is positive.  I would recommend follow-up with an OB/GYN provider.  I have written you for prenatal vitamins.  I would recommend abstaining from any tobacco use, alcohol use or any ibuprofen use as it is not safe in pregnancy.  If you do not have an OB/GYN there is one listed in your discharge paperwork.  Please call them to schedule appointment.  Return for any worsening symptoms

## 2020-10-13 LAB — GC/CHLAMYDIA PROBE AMP (~~LOC~~) NOT AT ARMC
Chlamydia: NEGATIVE
Comment: NEGATIVE
Comment: NORMAL
Neisseria Gonorrhea: NEGATIVE

## 2020-10-13 LAB — RPR: RPR Ser Ql: NONREACTIVE

## 2020-10-13 LAB — HIV ANTIBODY (ROUTINE TESTING W REFLEX): HIV Screen 4th Generation wRfx: NONREACTIVE

## 2020-10-20 ENCOUNTER — Emergency Department (HOSPITAL_COMMUNITY)
Admission: EM | Admit: 2020-10-20 | Discharge: 2020-10-20 | Disposition: A | Discharge status: Home / Self Care | Payer: Medicaid Other | Attending: Emergency Medicine | Admitting: Emergency Medicine

## 2020-10-20 ENCOUNTER — Encounter (HOSPITAL_COMMUNITY): Payer: Self-pay

## 2020-10-20 ENCOUNTER — Other Ambulatory Visit: Payer: Self-pay

## 2020-10-20 DIAGNOSIS — O26891 Other specified pregnancy related conditions, first trimester: Secondary | ICD-10-CM | POA: Diagnosis not present

## 2020-10-20 DIAGNOSIS — Z3A00 Weeks of gestation of pregnancy not specified: Secondary | ICD-10-CM | POA: Diagnosis not present

## 2020-10-20 DIAGNOSIS — K029 Dental caries, unspecified: Secondary | ICD-10-CM | POA: Insufficient documentation

## 2020-10-20 DIAGNOSIS — Z87891 Personal history of nicotine dependence: Secondary | ICD-10-CM | POA: Insufficient documentation

## 2020-10-20 DIAGNOSIS — K047 Periapical abscess without sinus: Secondary | ICD-10-CM | POA: Diagnosis not present

## 2020-10-20 MED ORDER — CLINDAMYCIN HCL 300 MG PO CAPS: 300.0000 mg | ORAL_CAPSULE | Freq: Three times a day (TID) | ORAL | 0 refills | Status: AC

## 2020-10-20 MED ORDER — ACETAMINOPHEN 325 MG PO TABS
650.0000 mg | ORAL_TABLET | Freq: Once | ORAL | Status: AC
  Administered 2020-10-20: 650 mg via ORAL
  Filled 2020-10-20: qty 2

## 2020-10-20 MED ORDER — AMOXICILLIN-POT CLAVULANATE 875-125 MG PO TABS: 1.0000 | ORAL_TABLET | Freq: Once | ORAL | Status: DC

## 2020-10-20 MED ORDER — CLINDAMYCIN HCL 300 MG PO CAPS
300.0000 mg | ORAL_CAPSULE | Freq: Once | ORAL | Status: AC
  Administered 2020-10-20: 300 mg via ORAL
  Filled 2020-10-20: qty 1

## 2020-10-20 NOTE — Discharge Instructions (Addendum)
You were found to have a dental infection today.  Please take entire course of antibiotics as directed.  This antibiotic is safe in pregnancy.  Continue using Tylenol for pain.  You will need to follow-up with your dentist for continued management of this. Please see dental resources below. Return to the emergency department for fevers, swelling or pain under the tongue or in the neck, difficulty breathing or swallowing, nausea or vomiting that does not stop, or any other new or concerning symptoms.

## 2020-10-20 NOTE — ED Provider Notes (Signed)
[facility]-EMERGENCY DEPT Provider Note   CSN: [id number] Arrival date & time: 10/20/20  1947     History Chief Complaint  Patient presents with  . Dental Pain    Cathy Weber is a 20 y.o. female who presents with 3 days of right-sided dental pain, currently in her first trimester of pregnancy.  Denies any nausea, vomiting, fevers, chills, difficulty swallowing.  There is mild swelling and tenderness palpation of the right cheek, states she has an appointment scheduled with her dentist.  I personally reviewed this patient's medical records.  She is history of sickle cell trait and alpha Thal silent carrier, currently pregnant.   HPI     Past Medical History:  Diagnosis Date  . Alpha thalassemia silent carrier 11/19/2018  . Medical history non-contributory   . Sickle cell trait (HCC) 11/19/2018    Patient Active Problem List   Diagnosis Date Noted  . Sickle cell trait (HCC) 11/19/2018  . Alpha thalassemia silent carrier 11/19/2018    Past Surgical History:  Procedure Laterality Date  . NO PAST SURGERIES       OB History    Gravida  2   Para  1   Term  1   Preterm      AB      Living  1     SAB      IAB      Ectopic      Multiple  0   Live Births  1           Family History  Problem Relation Age of Onset  . Healthy Mother   . Healthy Father     Social History   Tobacco Use  . Smoking status: Former Games developer  . Smokeless tobacco: Never Used  Vaping Use  . Vaping Use: Never used  Substance Use Topics  . Alcohol use: Not Currently  . Drug use: Not Currently    Types: Marijuana    Home Medications Prior to Admission medications   Medication Sig Start Date End Date Taking? Authorizing Provider  clindamycin (CLEOCIN) 300 MG capsule Take 1 capsule (300 mg total) by mouth 3 (three) times daily for 10 days. 10/20/20 10/30/20 Yes Scottie Metayer, Eugene Gavia, PA-C  Prenatal Vit-Fe Fumarate-FA (PRENATAL VITAMIN) 27-0.8 MG  TABS Take 1 tablet daily 10/12/20   Henderly, Britni A, PA-C  Blood Pressure Monitoring (BLOOD PRESSURE CUFF) MISC 1 Device by Does not apply route once a week. For weekly monitoring of blood pressure Patient not taking: Reported on 05/04/2019 11/06/18 02/17/20  Tereso Newcomer, MD    Allergies    Patient has no known allergies.  Review of Systems   Review of Systems  HENT: Positive for dental problem.   Respiratory: Negative.   Cardiovascular: Negative.   Gastrointestinal: Negative.   Genitourinary:       Pregnant  Musculoskeletal: Negative.   Skin: Negative.   Neurological: Negative.     Physical Exam Updated Vital Signs BP 137/85 (BP Location: Left Arm)   Pulse 76   Temp 98.3 F (36.8 C) (Oral)   Resp 18   Ht 5\' 10"  (1.778 m)   Wt 77.1 kg   LMP 01/30/2020   SpO2 100%   BMI 24.39 kg/m   Physical Exam Vitals and nursing note reviewed.  Constitutional:      Appearance: She is not ill-appearing or toxic-appearing.  HENT:     Head: Normocephalic and atraumatic.     Nose: Nose normal.  Mouth/Throat:     Mouth: Mucous membranes are moist.     Dentition: Abnormal dentition. Dental tenderness, gingival swelling and dental caries present.     Pharynx: Oropharynx is clear. Uvula midline. No oropharyngeal exudate, posterior oropharyngeal erythema or uvula swelling.     Tonsils: No tonsillar exudate.   Eyes:     General: Lids are normal. Vision grossly intact.        Right eye: No discharge.        Left eye: No discharge.     Extraocular Movements: Extraocular movements intact.     Conjunctiva/sclera: Conjunctivae normal.     Pupils: Pupils are equal, round, and reactive to light.  Neck:     Trachea: Trachea and phonation normal.  Cardiovascular:     Rate and Rhythm: Normal rate and regular rhythm.     Pulses: Normal pulses.     Heart sounds: Normal heart sounds. No murmur heard.   Pulmonary:     Effort: Pulmonary effort is normal. No tachypnea, bradypnea,  accessory muscle usage, prolonged expiration or respiratory distress.     Breath sounds: Normal breath sounds. No wheezing or rales.  Chest:     Chest wall: No mass, lacerations, deformity, swelling, tenderness, crepitus or edema.  Abdominal:     General: Bowel sounds are normal. There is no distension.     Palpations: Abdomen is soft.     Tenderness: There is no abdominal tenderness. There is no right CVA tenderness, left CVA tenderness, guarding or rebound.  Musculoskeletal:        General: No deformity.     Cervical back: Normal range of motion and neck supple. No rigidity or crepitus. No pain with movement, spinous process tenderness or muscular tenderness.     Right lower leg: No edema.     Left lower leg: No edema.  Lymphadenopathy:     Cervical: No cervical adenopathy.  Skin:    General: Skin is warm and dry.  Neurological:     Mental Status: She is alert. Mental status is at baseline.  Psychiatric:        Mood and Affect: Mood normal.     ED Results / Procedures / Treatments   Labs (all labs ordered are listed, but only abnormal results are displayed) Labs Reviewed - No data to display  EKG None  Radiology No results found.  Procedures Procedures  Medications Ordered in ED Medications  clindamycin (CLEOCIN) capsule 300 mg (300 mg Oral Given 10/20/20 2201)  acetaminophen (TYLENOL) tablet 650 mg (650 mg Oral Given 10/20/20 2201)    ED Course  I have reviewed the triage vital signs and the nursing notes.  Pertinent labs & imaging results that were available during my care of the patient were reviewed by me and considered in my medical decision making (see chart for details).    MDM Rules/Calculators/A&P                          43 female presents with concern for dental pain.  Vital signs are normal intake.  Cardiopulmonary exam normal abdominal exam is benign.  HEENT exam revealed multiple dental caries with surrounding buccal mucosal gingival edema, no area  of fluctuance to suggest abscess amenable to drainage.  Will administer first dose of antibiotics in the emergency department as well as a dose of Tylenol as patient is pregnant and wants to avoid oxycodone.  Discharge close antibiotics and close dental follow-up.  No  further work-up warranted in the ED at this time  Cathy Weber voiced understanding of her medical evaluation and treatment plan.  Each of her questions was answered to her expressed satisfaction.  Return precautions given.  Patient is stable and appropriate for discharge.  This chart was dictated using voice recognition software, Dragon. Despite the best efforts of this provider to proofread and correct errors, errors may still occur which can change documentation meaning.  Final Clinical Impression(s) / ED Diagnoses Final diagnoses:  Dental infection    Rx / DC Orders ED Discharge Orders         Ordered    clindamycin (CLEOCIN) 300 MG capsule  3 times daily        10/20/20 2156           Donovon Micheletti, Eugene Gavia, PA-C 10/21/20 0042    Mancel Bale, MD 10/25/20 1022

## 2020-10-20 NOTE — ED Triage Notes (Signed)
Pt reports right dental pain for a few days.

## 2021-04-13 ENCOUNTER — Other Ambulatory Visit: Payer: Self-pay

## 2021-04-13 ENCOUNTER — Ambulatory Visit (HOSPITAL_COMMUNITY)
Admission: EM | Admit: 2021-04-13 | Discharge: 2021-04-13 | Disposition: A | Discharge status: Home / Self Care | Payer: Medicaid Other | Attending: Emergency Medicine | Admitting: Emergency Medicine

## 2021-04-13 ENCOUNTER — Encounter (HOSPITAL_COMMUNITY): Payer: Self-pay

## 2021-04-13 DIAGNOSIS — Z202 Contact with and (suspected) exposure to infections with a predominantly sexual mode of transmission: Secondary | ICD-10-CM

## 2021-04-13 LAB — POC URINE PREG, ED: Preg Test, Ur: NEGATIVE

## 2021-04-13 LAB — POCT URINALYSIS DIPSTICK, ED / UC
Bilirubin Urine: NEGATIVE
Glucose, UA: NEGATIVE mg/dL
Ketones, ur: 15 mg/dL — AB
Nitrite: NEGATIVE
Protein, ur: NEGATIVE mg/dL
Specific Gravity, Urine: 1.025 (ref 1.005–1.030)
Urobilinogen, UA: 0.2 mg/dL (ref 0.0–1.0)
pH: 5.5 (ref 5.0–8.0)

## 2021-04-13 MED ORDER — LIDOCAINE HCL (PF) 1 % IJ SOLN
INTRAMUSCULAR | Status: AC
  Filled 2021-04-13: qty 30

## 2021-04-13 MED ORDER — CEFTRIAXONE SODIUM 500 MG IJ SOLR
INTRAMUSCULAR | Status: AC
  Filled 2021-04-13: qty 500

## 2021-04-13 MED ORDER — DOXYCYCLINE HYCLATE 100 MG PO CAPS: 100.0000 mg | ORAL_CAPSULE | Freq: Two times a day (BID) | ORAL | 0 refills | Status: AC

## 2021-04-13 MED ORDER — CEFTRIAXONE SODIUM 500 MG IJ SOLR
500.0000 mg | Freq: Once | INTRAMUSCULAR | Status: AC
  Administered 2021-04-13: 500 mg via INTRAMUSCULAR

## 2021-04-13 NOTE — ED Provider Notes (Signed)
[facility]  ____________________________________________  Time seen: Approximately 5:44 PM  I have reviewed the triage vital signs and the nursing notes.   HISTORY  Chief Complaint STD Testing   Historian Patient     HPI Cathy Weber is a 20 y.o. female presents to the urgent care with concern for possible gonorrhea.  Patient has had unprotected sex with a partner who recently tested positive.  Patient is currently asymptomatic at this time.  Denies dysuria, hematuria, increased urinary frequency, changes in vaginal discharge, vaginal pruritus, deep dyspareunia or vomiting.   Past Medical History:  Diagnosis Date   Alpha thalassemia silent carrier 11/19/2018   Medical history non-contributory    Sickle cell trait (HCC) 11/19/2018     Immunizations up to date:  Yes.     Past Medical History:  Diagnosis Date   Alpha thalassemia silent carrier 11/19/2018   Medical history non-contributory    Sickle cell trait (HCC) 11/19/2018    Patient Active Problem List   Diagnosis Date Noted   Sickle cell trait (HCC) 11/19/2018   Alpha thalassemia silent carrier 11/19/2018    Past Surgical History:  Procedure Laterality Date   NO PAST SURGERIES      Prior to Admission medications   Medication Sig Start Date End Date Taking? Authorizing Provider  doxycycline (VIBRAMYCIN) 100 MG capsule Take 1 capsule (100 mg total) by mouth 2 (two) times daily for 7 days. 04/13/21 04/20/21 Yes Orvil Feil, PA-C  Prenatal Vit-Fe Fumarate-FA (PRENATAL VITAMIN) 27-0.8 MG TABS Take 1 tablet daily 10/12/20   Henderly, Britni A, PA-C  Blood Pressure Monitoring (BLOOD PRESSURE CUFF) MISC 1 Device by Does not apply route once a week. For weekly monitoring of blood pressure Patient not taking: Reported on 05/04/2019 11/06/18 02/17/20  Tereso Newcomer, MD    Allergies Patient has no known allergies.  Family History  Problem Relation Age of Onset   Healthy Mother    Healthy  Father     Social History Social History   Tobacco Use   Smoking status: Former   Smokeless tobacco: Never  Building services engineer Use: Never used  Substance Use Topics   Alcohol use: Not Currently   Drug use: Not Currently    Types: Marijuana     Review of Systems  Constitutional: No fever/chills Eyes:  No discharge ENT: No upper respiratory complaints. Respiratory: no cough. No SOB/ use of accessory muscles to breath Gastrointestinal:   No nausea, no vomiting.  No diarrhea.  No constipation. Musculoskeletal: Negative for musculoskeletal pain. Skin: Negative for rash, abrasions, lacerations, ecchymosis.    ____________________________________________   PHYSICAL EXAM:  VITAL SIGNS: ED Triage Vitals  Enc Vitals Group     BP 04/13/21 1716 119/78     Pulse Rate 04/13/21 1716 (!) 103     Resp 04/13/21 1716 18     Temp 04/13/21 1716 99.3 F (37.4 C)     Temp Source 04/13/21 1716 Oral     SpO2 04/13/21 1716 96 %     Weight --      Height --      Head Circumference --      Peak Flow --      Pain Score 04/13/21 1719 0     Pain Loc --      Pain Edu? --      Excl. in GC? --      Constitutional: Alert and oriented. Well appearing and in no acute distress. Eyes: Conjunctivae are  normal. PERRL. EOMI. Head: Atraumatic. ENT:      Nose: No congestion/rhinnorhea.      Mouth/Throat: Mucous membranes are moist.  Neck: No stridor.  No cervical spine tenderness to palpation. Cardiovascular: Normal rate, regular rhythm. Normal S1 and S2.  Good peripheral circulation. Respiratory: Normal respiratory effort without tachypnea or retractions. Lungs CTAB. Good air entry to the bases with no decreased or absent breath sounds Gastrointestinal: Bowel sounds x 4 quadrants. Soft and nontender to palpation. No guarding or rigidity. No distention. Musculoskeletal: Full range of motion to all extremities. No obvious deformities noted Neurologic:  Normal for age. No gross focal neurologic  deficits are appreciated.  Skin:  Skin is warm, dry and intact. No rash noted. Psychiatric: Mood and affect are normal for age. Speech and behavior are normal.   ____________________________________________   LABS (all labs ordered are listed, but only abnormal results are displayed)  Labs Reviewed  POCT URINALYSIS DIPSTICK, ED / UC - Abnormal; Notable for the following components:      Result Value   Ketones, ur 15 (*)    Hgb urine dipstick TRACE (*)    Leukocytes,Ua SMALL (*)    All other components within normal limits  POC URINE PREG, ED  CERVICOVAGINAL ANCILLARY ONLY   ____________________________________________  EKG   ____________________________________________  RADIOLOGY   No results found.  ____________________________________________    PROCEDURES  Procedure(s) performed:     Procedures     Medications  cefTRIAXone (ROCEPHIN) injection 500 mg (500 mg Intramuscular Given 04/13/21 1812)     ____________________________________________   INITIAL IMPRESSION / ASSESSMENT AND PLAN / ED COURSE  Pertinent labs & imaging results that were available during my care of the patient were reviewed by me and considered in my medical decision making (see chart for details).      Assessment and Plan: STD testing:  20 year old female presents to the urgent care for gonorrhea and Chlamydia testing.  Patient received IM Rocephin in the urgent care and was discharged with doxycycline after urine pregnancy testing was negative.  Patient was advised to abstain from unprotected sex for 2 weeks.   ____________________________________________  FINAL CLINICAL IMPRESSION(S) / ED DIAGNOSES  Final diagnoses:  STD exposure      NEW MEDICATIONS STARTED DURING THIS VISIT:  ED Discharge Orders          Ordered    doxycycline (VIBRAMYCIN) 100 MG capsule  2 times daily        04/13/21 1800                This chart was dictated using voice  recognition software/Dragon. Despite best efforts to proofread, errors can occur which can change the meaning. Any change was purely unintentional.     Orvil Feil, PA-C 04/13/21 1939

## 2021-04-13 NOTE — ED Triage Notes (Signed)
Pt presents for STD testing with no known symptoms after partner was tested & treated for chlamydia recently.

## 2021-04-13 NOTE — Discharge Instructions (Signed)
Take doxycycline twice daily for the next 7 days. No unprotected sex for the next 14 days.

## 2021-04-14 LAB — CERVICOVAGINAL ANCILLARY ONLY
Bacterial Vaginitis (gardnerella): POSITIVE — AB
Candida Glabrata: NEGATIVE
Candida Vaginitis: NEGATIVE
Chlamydia: NEGATIVE
Comment: NEGATIVE
Comment: NEGATIVE
Comment: NEGATIVE
Comment: NEGATIVE
Comment: NEGATIVE
Comment: NORMAL
Neisseria Gonorrhea: POSITIVE — AB
Trichomonas: NEGATIVE

## 2021-04-18 ENCOUNTER — Telehealth: Payer: Self-pay

## 2021-04-18 MED ORDER — METRONIDAZOLE 500 MG PO TABS: 500.0000 mg | ORAL_TABLET | Freq: Two times a day (BID) | ORAL | 0 refills | Status: DC

## 2021-11-05 ENCOUNTER — Other Ambulatory Visit: Payer: Self-pay

## 2021-11-05 ENCOUNTER — Encounter (HOSPITAL_COMMUNITY): Payer: Self-pay

## 2021-11-05 ENCOUNTER — Emergency Department (HOSPITAL_COMMUNITY)
Admission: EM | Admit: 2021-11-05 | Discharge: 2021-11-05 | Disposition: A | Discharge status: Home / Self Care | Payer: Medicaid Other | Attending: Emergency Medicine | Admitting: Emergency Medicine

## 2021-11-05 DIAGNOSIS — N898 Other specified noninflammatory disorders of vagina: Secondary | ICD-10-CM | POA: Insufficient documentation

## 2021-11-05 LAB — URINALYSIS, ROUTINE W REFLEX MICROSCOPIC
Bilirubin Urine: NEGATIVE
Glucose, UA: NEGATIVE mg/dL
Ketones, ur: NEGATIVE mg/dL
Nitrite: NEGATIVE
Protein, ur: NEGATIVE mg/dL
Specific Gravity, Urine: 1.012 (ref 1.005–1.030)
pH: 5 (ref 5.0–8.0)

## 2021-11-05 LAB — I-STAT BETA HCG BLOOD, ED (MC, WL, AP ONLY): I-stat hCG, quantitative: <5 m[IU]/mL (ref <5)

## 2021-11-05 LAB — WET PREP, GENITAL
Clue Cells Wet Prep HPF POC: NONE SEEN
Sperm: NONE SEEN
Trich, Wet Prep: NONE SEEN
WBC, Wet Prep HPF POC: >=10 — AB (ref <10)
Yeast Wet Prep HPF POC: NONE SEEN

## 2021-11-05 LAB — HIV ANTIBODY (ROUTINE TESTING W REFLEX): HIV Screen 4th Generation wRfx: NONREACTIVE

## 2021-11-05 MED ORDER — FLUCONAZOLE 150 MG PO TABS
150.0000 mg | ORAL_TABLET | Freq: Once | ORAL | Status: AC
  Administered 2021-11-05: 150 mg via ORAL
  Filled 2021-11-05: qty 1

## 2021-11-05 NOTE — ED Provider Notes (Signed)
?[facility]-EMERGENCY DEPT ?Provider Note ? ? ?CSN: [id number] ?Arrival date & time: 11/05/21  1755 ? ?  ? ?History ? ?Chief Complaint  ?Patient presents with  ? SEXUALLY TRANSMITTED DISEASE  ? ? ?Cathy Weber is a 21 y.o. female. ? ?The history is provided by the patient.  ?Vaginal Discharge ?Quality:  Yellow and thick ?Severity:  Moderate ?Onset quality:  Gradual ?Duration:  1 week ?Timing:  Constant ?Progression:  Worsening ?Chronicity:  New ?Relieved by:  Nothing ?Worsened by:  Nothing ?Ineffective treatments:  None tried ?Associated symptoms: no abdominal pain, no urinary frequency and no vomiting   ? ?  ? ?Home Medications ?Prior to Admission medications   ?Medication Sig Start Date End Date Taking? Authorizing Provider  ?metroNIDAZOLE (FLAGYL) 500 MG tablet Take 1 tablet (500 mg total) by mouth 2 (two) times daily. 04/18/21   Merrilee Jansky, MD  ?Prenatal Vit-Fe Fumarate-FA (PRENATAL VITAMIN) 27-0.8 MG TABS Take 1 tablet daily 10/12/20   Henderly, Britni A, PA-C  ?Blood Pressure Monitoring (BLOOD PRESSURE CUFF) MISC 1 Device by Does not apply route once a week. For weekly monitoring of blood pressure ?Patient not taking: Reported on 05/04/2019 11/06/18 02/17/20  Tereso Newcomer, MD  ?   ? ?Allergies    ?Patient has no known allergies.   ? ?Review of Systems   ?Review of Systems  ?Gastrointestinal:  Negative for abdominal pain and vomiting.  ?Genitourinary:  Positive for vaginal discharge.  ? ?Physical Exam ?Updated Vital Signs ?BP 114/75 (BP Location: Right Arm)   Pulse 70   Temp 97.9 ?F (36.6 ?C) (Oral)   Resp 16   SpO2 99%  ?Physical Exam ?Vitals and nursing note reviewed. Exam conducted with a chaperone present.  ?Constitutional:   ?   General: She is not in acute distress. ?   Appearance: She is well-developed. She is not diaphoretic.  ?HENT:  ?   Head: Normocephalic and atraumatic.  ?   Right Ear: External ear normal.  ?   Left Ear: External ear normal.  ?   Nose: Nose  normal.  ?   Mouth/Throat:  ?   Mouth: Mucous membranes are moist.  ?Eyes:  ?   General: No scleral icterus. ?   Conjunctiva/sclera: Conjunctivae normal.  ?Cardiovascular:  ?   Rate and Rhythm: Normal rate and regular rhythm.  ?   Heart sounds: Normal heart sounds. No murmur heard. ?  No friction rub. No gallop.  ?Pulmonary:  ?   Effort: Pulmonary effort is normal. No respiratory distress.  ?   Breath sounds: Normal breath sounds.  ?Abdominal:  ?   General: Bowel sounds are normal. There is no distension.  ?   Palpations: Abdomen is soft. There is no mass.  ?   Tenderness: There is no abdominal tenderness. There is no guarding.  ?Genitourinary: ?   Comments: .Pelvic exam: normal external genitalia, vulva, vagina, cervix, uterus and adnexa. Mild blood from cervical os, LMP 10/07/2021 ? ?Musculoskeletal:  ?   Cervical back: Normal range of motion.  ?Skin: ?   General: Skin is warm and dry.  ?Neurological:  ?   Mental Status: She is alert and oriented to person, place, and time.  ?Psychiatric:     ?   Behavior: Behavior normal.  ? ? ?ED Results / Procedures / Treatments   ?Labs ?(all labs ordered are listed, but only abnormal results are displayed) ?Labs Reviewed - No data to display ? ?EKG ?None ? ?Radiology ?No results  found. ? ?Procedures ?Procedures  ? ? ?Medications Ordered in ED ?Medications - No data to display ? ?ED Course/ Medical Decision Making/ A&P ?Clinical Course as of 11/05/21 2005  ?Sun Nov 05, 2021  ?2005 Urinalysis, Routine w reflex microscopic Urine, Clean Catch(!) ?Urine appears contaminated [AH]  ?2005 Wet prep, genital(!) ?No significant findings on wet prep [AH]  ?2005 I-Stat beta hCG blood, ED ?Negative pregnancy test [AH]  ?  ?Clinical Course User Index ?[AH] Arthor Captain, PA-C  ? ?                        ?Medical Decision Making ?With vaginal irritation and itching, she appears to have started her..  The remainder of her exam is fairly benign.  Given her work-up I see that there is no acute  findings.  We will give her an oral Diflucan here in case she has early yeast vaginitis as the cause of her symptoms.  Will discharge with safe sex education and she will be contacted if her STI tests are positive.  No signs of PID ? ?Amount and/or Complexity of Data Reviewed ?Labs: ordered. ? ?Risk ?Prescription drug management. ? ? ? ? ? ? ? ? ? ? ?Final Clinical Impression(s) / ED Diagnoses ?Final diagnoses:  ?None  ? ? ?Rx / DC Orders ?ED Discharge Orders   ? ? None  ? ?  ? ? ?  ?Arthor Captain, PA-C ?11/05/21 2006 ? ?  ?Lorre Nick, MD ?11/06/21 4696 ? ?

## 2021-11-05 NOTE — Discharge Instructions (Signed)
Your exam was negative- ?I gave you a tablet to treat for possible yeast infection as the cause of your vaginal itching. ?Your STD tests are still pending. If you have a positive result you will be contacted for treatment. ?

## 2021-11-05 NOTE — ED Triage Notes (Signed)
Pt arrived via POV, requesting to be checked for STDs. Denies any known exposure. Endorsing some white/yellow discharge and vaginal itching/burning.  ?

## 2021-11-06 LAB — GC/CHLAMYDIA PROBE AMP (~~LOC~~) NOT AT ARMC
Chlamydia: NEGATIVE
Comment: NEGATIVE
Comment: NORMAL
Neisseria Gonorrhea: NEGATIVE

## 2021-11-06 LAB — RPR: RPR Ser Ql: NONREACTIVE

## 2021-11-28 ENCOUNTER — Ambulatory Visit (HOSPITAL_COMMUNITY)
Admission: EM | Admit: 2021-11-28 | Discharge: 2021-11-28 | Disposition: A | Discharge status: Home / Self Care | Payer: Medicaid Other | Attending: Emergency Medicine | Admitting: Emergency Medicine

## 2021-11-28 ENCOUNTER — Encounter (HOSPITAL_COMMUNITY): Payer: Self-pay

## 2021-11-28 DIAGNOSIS — N898 Other specified noninflammatory disorders of vagina: Secondary | ICD-10-CM | POA: Diagnosis present

## 2021-11-28 MED ORDER — METRONIDAZOLE 500 MG PO TABS: 500.0000 mg | ORAL_TABLET | Freq: Two times a day (BID) | ORAL | 0 refills | Status: DC

## 2021-11-28 NOTE — ED Provider Notes (Signed)
[place]    CSN: [id number] Arrival date & time: 11/28/21  1723      History   Chief Complaint Chief Complaint  Patient presents with   SEXUALLY TRANSMITTED DISEASE    Testing     HPI Cathy Weber is a 21 y.o. female.   Patient presents with Cathy Weber to yellowish vaginal discharge with a slight fishlike odor for 2 weeks.  Associated urinary frequency and urgency.  Sexually active, 1 female partner, no condom use, no known exposure.  Was evaluated on 11/05/2021 for same symptoms in the emergency department, all testing negative that day, was administered Diflucan  which patient deems ineffective.  Denies hematuria, vaginal itching or irritation, dysuria, new rash or lesions, lower abdominal pain or pressure, flank pain or fever or chills.  Last menstrual 11/06/2021.  Past Medical History:  Diagnosis Date   Alpha thalassemia silent carrier 11/19/2018   Medical history non-contributory    Sickle cell trait ([place]) 11/19/2018    Patient Active Problem List   Diagnosis Date Noted   Sickle cell trait ([place]) 11/19/2018   Alpha thalassemia silent carrier 11/19/2018    Past Surgical History:  Procedure Laterality Date   NO PAST SURGERIES      OB History     Gravida  2   Para  1   Term  1   Preterm      AB      Living  1      SAB      IAB      Ectopic      Multiple  0   Live Births  1            Home Medications    Prior to Admission medications   Medication Sig Start Date End Date Taking? Authorizing Provider  Blood Pressure Monitoring (BLOOD PRESSURE CUFF) MISC 1 Device by Does not apply route once a week. For weekly monitoring of blood pressure Patient not taking: Reported on 05/04/2019 11/06/18 02/17/20  Osborne Oman, MD    Family History Family History  Problem Relation Age of Onset   Healthy Mother    Healthy Father     Social History Social History   Tobacco Use   Smoking status: Former   Smokeless tobacco: Never   Scientific laboratory technician Use: Never used  Substance Use Topics   Alcohol use: Not Currently   Drug use: Not Currently    Types: Marijuana     Allergies   Patient has no known allergies.   Review of Systems Review of Systems  Constitutional: Negative.   Respiratory: Negative.    Cardiovascular: Negative.   Genitourinary:  Positive for frequency, urgency and vaginal discharge. Negative for decreased urine volume, difficulty urinating, dyspareunia, dysuria, enuresis, flank pain, genital sores, hematuria, menstrual problem, pelvic pain, vaginal bleeding and vaginal pain.  Skin: Negative.   Neurological: Negative.     Physical Exam Triage Vital Signs ED Triage Vitals  Enc Vitals Group     BP 11/28/21 1740 109/78     Pulse Rate 11/28/21 1740 60     Resp 11/28/21 1740 16     Temp 11/28/21 1740 98 F (36.7 C)     Temp Source 11/28/21 1740 Oral     SpO2 11/28/21 1740 98 %     Weight 11/28/21 1744 180 lb (81.6 kg)     Height 11/28/21 1744 5\' 10"  (1.778 m)     Head Circumference --  Peak Flow --      Pain Score 11/28/21 1744 0     Pain Loc --      Pain Edu? --      Excl. in [place]? --    No data found.  Updated Vital Signs BP 109/78 (BP Location: Right Arm)   Pulse 60   Temp 98 F (36.7 C) (Oral)   Resp 16   Ht 5\' 10"  (1.778 m)   Wt 180 lb (81.6 kg)   LMP 11/06/2021 (Exact Date)   SpO2 98%   BMI 25.83 kg/m   Visual Acuity Right Eye Distance:   Left Eye Distance:   Bilateral Distance:    Right Eye Near:   Left Eye Near:    Bilateral Near:     Physical Exam Constitutional:      Appearance: Normal appearance.  HENT:     Head: Normocephalic.  Eyes:     Extraocular Movements: Extraocular movements intact.  Pulmonary:     Effort: Pulmonary effort is normal.  Genitourinary:    Comments: deferred Skin:    General: Skin is warm and dry.  Neurological:     Mental Status: She is alert and oriented to person, place, and time. Mental status is at baseline.   Psychiatric:        Mood and Affect: Mood normal.        Behavior: Behavior normal.     UC Treatments / Results  Labs (all labs ordered are listed, but only abnormal results are displayed) Labs Reviewed  CERVICOVAGINAL ANCILLARY ONLY    EKG   Radiology No results found.  Procedures Procedures (including critical care time)  Medications Ordered in UC Medications - No data to display  Initial Impression / Assessment and Plan / UC Course  I have reviewed the triage vital signs and the nursing notes.  Pertinent labs & imaging results that were available during my care of the patient were reviewed by me and considered in my medical decision making (see chart for details).  Vaginal discharge  We will prophylactically treat for bacterial vaginosis based on fishlike odor and persistent discharge, metronidazole 7-day course prescribed, advised abstaining from alcohol to prevent GI irritation, STI labs are pending, will treat per protocol, advised abstinence until lab results, treatment is complete and all symptoms have subsided, advised condom use moving forward with all sexual encounters, may follow-up with this urgent care as needed Final Clinical Impressions(s) / UC Diagnoses   Final diagnoses:  None   Discharge Instructions   None    ED Prescriptions   None    PDMP not reviewed this encounter.   Hans Eden, [place] 11/28/21 [phone]

## 2021-11-28 NOTE — Discharge Instructions (Signed)
Today you are being treated prophylactically for  Bacterial vaginosis  ? ?Take Metronidazole 500 mg twice a day for 7 days, do not drink alcohol while using medication, this will make you feel sick  ? ?Bacterial vaginosis which results from an overgrowth of one on several organisms that are normally present in your vagina. Vaginosis is an inflammation of the vagina that can result in discharge, itching and pain. ? ?Labs pending 2-3 days, you will be contacted if positive for any sti and treatment will be sent to the pharmacy, you will have to return to the clinic if positive for gonorrhea to receive treatment  ? ?Please refrain from having sex until labs results, if positive please refrain from having sex until treatment complete and symptoms resolve  ? ?If positive for HIV, Syphilis, Chlamydia  gonorrhea or trichomoniasis please notify partner or partners so they may tested as well ? ?Moving forward, it is recommended you use some form of protection against the transmission of sti infections  such as condoms or dental dams with each sexual encounter   ? ? ?In addition: ?Avoid baths, hot tubs and whirlpool spas.  ?Don't use scented or harsh soaps ?Avoid irritants. These include scented tampons and pads. ?Wipe from front to back after using the toilet. ?Don't douche. Your vagina doesn't require cleansing other than normal bathing.  ?Use a condom.  ?Wear cotton underwear, this fabric absorbs some moisture.  ? ? ?  ?

## 2021-11-28 NOTE — ED Triage Notes (Signed)
Patient having some vaginal discharge, onset 2 weeks ago. Discharge is like a white/yellow color. No itching or burning.

## 2021-11-29 LAB — CERVICOVAGINAL ANCILLARY ONLY
Bacterial Vaginitis (gardnerella): POSITIVE — AB
Candida Glabrata: NEGATIVE
Candida Vaginitis: NEGATIVE
Chlamydia: NEGATIVE
Comment: NEGATIVE
Comment: NEGATIVE
Comment: NEGATIVE
Comment: NEGATIVE
Comment: NEGATIVE
Comment: NORMAL
Neisseria Gonorrhea: NEGATIVE
Trichomonas: NEGATIVE

## 2022-05-22 ENCOUNTER — Ambulatory Visit (HOSPITAL_COMMUNITY)
Admission: EM | Admit: 2022-05-22 | Discharge: 2022-05-22 | Disposition: A | Discharge status: Home / Self Care | Payer: Medicaid Other | Attending: Family Medicine | Admitting: Family Medicine

## 2022-05-22 ENCOUNTER — Encounter (HOSPITAL_COMMUNITY): Payer: Self-pay

## 2022-05-22 DIAGNOSIS — N898 Other specified noninflammatory disorders of vagina: Secondary | ICD-10-CM | POA: Diagnosis present

## 2022-05-22 NOTE — ED Triage Notes (Signed)
Pt requesting STD testing, states has a new partner. Pt denies sx's or wants blood draw.

## 2022-05-22 NOTE — Discharge Instructions (Signed)
We have sent testing for sexually transmitted infections. We will notify you of any positive results once they are received. If required, we will prescribe any medications you might need.  Please refrain from all sexual activity for at least the next seven days.  

## 2022-05-23 LAB — CERVICOVAGINAL ANCILLARY ONLY
Bacterial Vaginitis (gardnerella): POSITIVE — AB
Candida Glabrata: NEGATIVE
Candida Vaginitis: POSITIVE — AB
Chlamydia: NEGATIVE
Comment: NEGATIVE
Comment: NEGATIVE
Comment: NEGATIVE
Comment: NEGATIVE
Comment: NEGATIVE
Comment: NORMAL
Neisseria Gonorrhea: NEGATIVE
Trichomonas: NEGATIVE

## 2022-05-23 NOTE — ED Provider Notes (Signed)
  [facility]   [id number] 05/22/22 Arrival Time: 1752  ASSESSMENT & PLAN:  1. Vaginal discharge    Declines empiric tx.    Discharge Instructions      We have sent testing for sexually transmitted infections. We will notify you of any positive results once they are received. If required, we will prescribe any medications you might need.  Please refrain from all sexual activity for at least the next seven days.     Without s/s of PID.  Labs Reviewed  CERVICOVAGINAL ANCILLARY ONLY    Will notify of any positive results. Instructed to refrain from sexual activity for at least seven days.  Reviewed expectations re: course of current medical issues. Questions answered. Outlined signs and symptoms indicating need for more acute intervention. Patient verbalized understanding. After Visit Summary given.   SUBJECTIVE:  Cathy Weber is a 21 y.o. female who presents with complaint of vaginal discharge. New female sexual partner. Denies: urinary frequency, dysuria, and gross hematuria. Afebrile. No abdominal or pelvic pain. Normal PO intake wihout n/v. No genital rashes or lesions.  Patient's last menstrual period was 05/01/2022.   OBJECTIVE:  Vitals:   05/22/22 1849  BP: 112/72  Pulse: 64  Resp: 18  Temp: 98.5 F (36.9 C)  TempSrc: Oral  SpO2: 100%     General appearance: alert, cooperative, appears stated age and no distress Lungs: unlabored respirations; speaks full sentences without difficulty Back: no CVA tenderness; FROM at waist Abdomen: soft, non-tender GU: deferred Skin: warm and dry Psychological: alert and cooperative; normal mood and affect.    Labs Reviewed  CERVICOVAGINAL ANCILLARY ONLY    No Known Allergies  Past Medical History:  Diagnosis Date   Alpha thalassemia silent carrier 11/19/2018   Medical history non-contributory    Sickle cell trait ([facility]) 11/19/2018   Family History  Problem Relation Age of Onset   Healthy  Mother    Healthy Father    Social History   Socioeconomic History   Marital status: Single    Spouse name: Not on file   Number of children: Not on file   Years of education: Not on file   Highest education level: Not on file  Occupational History   Not on file  Tobacco Use   Smoking status: Former   Smokeless tobacco: Never  Vaping Use   Vaping Use: Never used  Substance and Sexual Activity   Alcohol use: Not Currently   Drug use: Not Currently    Types: Marijuana   Sexual activity: Not Currently    Birth control/protection: None  Other Topics Concern   Not on file  Social History Narrative   Not on file   Social Determinants of Health   Financial Resource Strain: Not on file  Food Insecurity: Not on file  Transportation Needs: Not on file  Physical Activity: Not on file  Stress: Not on file  Social Connections: Not on file  Intimate Partner Violence: Not on file           Mardella Layman, MD 05/23/22 1125

## 2022-05-24 ENCOUNTER — Telehealth (HOSPITAL_COMMUNITY): Payer: Self-pay | Admitting: Emergency Medicine

## 2022-05-24 MED ORDER — METRONIDAZOLE 500 MG PO TABS: 500.0000 mg | ORAL_TABLET | Freq: Two times a day (BID) | ORAL | 0 refills | Status: DC

## 2022-05-24 MED ORDER — FLUCONAZOLE 150 MG PO TABS: 150.0000 mg | ORAL_TABLET | Freq: Once | ORAL | 0 refills | Status: AC

## 2022-08-20 ENCOUNTER — Other Ambulatory Visit: Payer: Self-pay

## 2022-08-20 ENCOUNTER — Emergency Department (HOSPITAL_COMMUNITY)
Admission: EM | Admit: 2022-08-20 | Discharge: 2022-08-20 | Disposition: A | Discharge status: Home / Self Care | Payer: Medicaid Other | Attending: Student | Admitting: Student

## 2022-08-20 DIAGNOSIS — Z113 Encounter for screening for infections with a predominantly sexual mode of transmission: Secondary | ICD-10-CM

## 2022-08-20 DIAGNOSIS — Z202 Contact with and (suspected) exposure to infections with a predominantly sexual mode of transmission: Secondary | ICD-10-CM | POA: Diagnosis present

## 2022-08-20 LAB — GC/CHLAMYDIA PROBE AMP (~~LOC~~) NOT AT ARMC
Chlamydia: NEGATIVE
Comment: NEGATIVE
Comment: NORMAL
Neisseria Gonorrhea: NEGATIVE

## 2022-08-20 LAB — WET PREP, GENITAL
Clue Cells Wet Prep HPF POC: NONE SEEN
Sperm: NONE SEEN
Trich, Wet Prep: NONE SEEN
WBC, Wet Prep HPF POC: <10 (ref <10)
Yeast Wet Prep HPF POC: NONE SEEN

## 2022-08-20 LAB — RAPID HIV SCREEN (HIV 1/2 AB+AG)
HIV 1/2 Antibodies: NONREACTIVE
HIV-1 P24 Antigen - HIV24: NONREACTIVE

## 2022-08-20 LAB — RPR: RPR Ser Ql: NONREACTIVE

## 2022-08-20 LAB — PREGNANCY, URINE: Preg Test, Ur: NEGATIVE

## 2022-08-20 NOTE — ED Provider Notes (Signed)
[place] EMERGENCY DEPARTMENT AT [facility] Provider Note   CSN: [id number] Arrival date & time: 08/20/22  [id number]     History  Chief Complaint  Patient presents with   STD Screening    Cathy Weber is a 22 y.o. female.  HPI   Patient without significant medical history presenting with request of an STD check.  Patient states that she is having slight vaginal discharge, but she states this typically happens after she gets BV, she is not endorsing any vaginal pain or urinary symptoms, no vaginal bleeding, states that she is not on birth control, recent finished her menstrual cycle.  No endorsing any stomach pains suprapubic pain flank pain or back pain.  She is only complaints.    Home Medications Prior to Admission medications   Medication Sig Start Date End Date Taking? Authorizing Provider  metroNIDAZOLE (FLAGYL) 500 MG tablet Take 1 tablet (500 mg total) by mouth 2 (two) times daily. 05/24/22   Chase Picket, MD  Blood Pressure Monitoring (BLOOD PRESSURE CUFF) MISC 1 Device by Does not apply route once a week. For weekly monitoring of blood pressure Patient not taking: Reported on 05/04/2019 11/06/18 02/17/20  Osborne Oman, MD      Allergies    Patient has no known allergies.    Review of Systems   Review of Systems  Constitutional:  Negative for chills and fever.  Respiratory:  Negative for shortness of breath.   Cardiovascular:  Negative for chest pain.  Gastrointestinal:  Negative for abdominal pain.  Genitourinary:  Positive for vaginal discharge.  Neurological:  Negative for headaches.    Physical Exam Updated Vital Signs BP 127/83 (BP Location: Left Arm)   Pulse 77   Temp 98.1 F (36.7 C) (Oral)   Resp 17   Ht '5\' 10"'$  (1.778 m)   Wt 83 kg   SpO2 100%   BMI 26.26 kg/m  Physical Exam Vitals and nursing note reviewed.  Constitutional:      General: She is not in acute distress.    Appearance: She is not ill-appearing.  HENT:      Head: Normocephalic and atraumatic.     Nose: No congestion.  Eyes:     Conjunctiva/sclera: Conjunctivae normal.  Cardiovascular:     Rate and Rhythm: Normal rate and regular rhythm.     Pulses: Normal pulses.  Pulmonary:     Effort: Pulmonary effort is normal.  Abdominal:     Palpations: Abdomen is soft.     Tenderness: There is no abdominal tenderness. There is no right CVA tenderness or left CVA tenderness.  Skin:    General: Skin is warm and dry.  Neurological:     Mental Status: She is alert.  Psychiatric:        Mood and Affect: Mood normal.     ED Results / Procedures / Treatments   Labs (all labs ordered are listed, but only abnormal results are displayed) Labs Reviewed  WET PREP, GENITAL  PREGNANCY, URINE  RAPID HIV SCREEN (HIV 1/2 AB+AG)  RPR  GC/CHLAMYDIA PROBE AMP ([place]) NOT AT [facility]    EKG None  Radiology No results found.  Procedures Procedures    Medications Ordered in ED Medications - No data to display  ED Course/ Medical Decision Making/ A&P                             Medical Decision Making Amount  and/or Complexity of Data Reviewed Labs: ordered.   This patient presents to the ED for concern of STD check, this involves an extensive number of treatment options, and is a complaint that carries with it a high risk of complications and morbidity.  The differential diagnosis includes PID, UTI, STD    Additional history obtained:  Additional history obtained from N/A External records from outside source obtained and reviewed including recent ER notes   Co morbidities that complicate the patient evaluation  N/A  Social Determinants of Health:  No PCP    Lab Tests:  I Ordered, and personally interpreted labs.  The pertinent results include: Syphilis, HIV, GC chlamydia pending at this time, wet prep negative, urine pregnancy negative   Imaging Studies ordered:  I ordered imaging studies including N/A I independently  visualized and interpreted imaging which showed N/A I agree with the radiologist interpretation   Cardiac Monitoring:  The patient was maintained on a cardiac monitor.  I personally viewed and interpreted the cardiac monitored which showed an underlying rhythm of: N/A   Medicines ordered and prescription drug management:  I ordered medication including N/A I have reviewed the patients home medicines and have made adjustments as needed  Critical Interventions:  N/a   Reevaluation: Presents with quest of STD check, will obtain basic lab work and reassess    Consultations Obtained:  N/a    Test Considered:  N/a    Rule out Suspicion for UTI [place] or kidney stone is low not endorsing any urinary symptoms, no suprapubic pain no flank tenderness no CVA tenderness.  Suspicion for ovarian torsion is also low as she has no  suprapubic pain no pelvic pain presentation is atypical.  I doubt PID as she is not endorsing any pelvic or vaginal pain, she does endorse some discharge but states that this is typical when she gets BV she is also nontoxic-appearing.  Ectopic pregnancy as urine pregnancy is negative.    Dispostion and problem list  After consideration of the diagnostic results and the patients response to treatment, I feel that the patent would benefit from discharge.  STD screening-will defer on treatment at this time as she is not nursing any symptoms, advised her to follow-up on her results.             Final Clinical Impression(s) / ED Diagnoses Final diagnoses:  Screening examination for STD (sexually transmitted disease)    Rx / DC Orders ED Discharge Orders     None         Marcello Fennel, PA-C 08/20/22 0407    Teressa Lower, MD 08/20/22 [phone]

## 2022-08-20 NOTE — ED Notes (Signed)
Pt refused blood work. Provided cup for urine collection. Unable to provide urine at this time

## 2022-08-20 NOTE — ED Triage Notes (Addendum)
Pt arrives via requesting STD check up. Denies concerns for exposure. Endorses vaginal white vaginal discharge and discomfort. No foul order. Denies vaginal/ abdominal.

## 2022-08-20 NOTE — Discharge Instructions (Signed)
Your gonorrhea and Chlamydia tests are pending, please follow-up on your MyChart in the next 24 to 48 hours for your results if they are positive please come back in for reassessment  You may always follow-up with the health department as a evaluate and treat STDs.  Come back to the emergency department if you develop chest pain, shortness of breath, severe abdominal pain, uncontrolled nausea, vomiting, diarrhea.

## 2022-09-07 ENCOUNTER — Encounter: Payer: Medicaid Other | Admitting: Radiology

## 2022-09-17 ENCOUNTER — Encounter: Payer: Medicaid Other | Admitting: Nurse Practitioner

## 2022-11-14 ENCOUNTER — Ambulatory Visit (HOSPITAL_COMMUNITY)
Admission: EM | Admit: 2022-11-14 | Discharge: 2022-11-14 | Disposition: A | Discharge status: Home / Self Care | Payer: Medicaid Other | Attending: Emergency Medicine | Admitting: Emergency Medicine

## 2022-11-14 ENCOUNTER — Other Ambulatory Visit: Payer: Self-pay

## 2022-11-14 ENCOUNTER — Encounter (HOSPITAL_COMMUNITY): Payer: Self-pay | Admitting: Emergency Medicine

## 2022-11-14 DIAGNOSIS — B9689 Other specified bacterial agents as the cause of diseases classified elsewhere: Secondary | ICD-10-CM | POA: Diagnosis present

## 2022-11-14 DIAGNOSIS — N760 Acute vaginitis: Secondary | ICD-10-CM | POA: Insufficient documentation

## 2022-11-14 DIAGNOSIS — N898 Other specified noninflammatory disorders of vagina: Secondary | ICD-10-CM

## 2022-11-14 LAB — POCT URINE PREGNANCY: Preg Test, Ur: NEGATIVE

## 2022-11-14 MED ORDER — METRONIDAZOLE 500 MG PO TABS: 500.0000 mg | ORAL_TABLET | Freq: Two times a day (BID) | ORAL | 0 refills | Status: AC

## 2022-11-14 NOTE — Discharge Instructions (Addendum)
We will call you if anything on your swab returns positive. Please abstain from sexual intercourse until your results return.  I am treating you for BV. Take the flagyl twice daily for 7 days. Take with food to avoid upset stomach. Do not drink alcohol while using this medication.  Please follow up with an ob/gyn

## 2022-11-14 NOTE — ED Triage Notes (Signed)
For 1 1/2 weeks has had clear vaginal discharge.  Reports there is an odor.

## 2022-11-14 NOTE — ED Provider Notes (Signed)
[facility]    CSN: [id number] Arrival date & time: 11/14/22  1631     History   Chief Complaint Chief Complaint  Patient presents with   Vaginal Discharge    HPI Cathy Weber is a 22 y.o. female.  Here with 1 to 2-week history of clear vaginal discharge.  Reporting some odor.  She is sexually active and reports unprotected intercourse.  No abdominal pain, dysuria, NVD, rash or lesions History of BV several times in the last year, feels similar to this  LMP 5/10  Past Medical History:  Diagnosis Date   Alpha thalassemia silent carrier 11/19/2018   Medical history non-contributory    Sickle cell trait (HCC) 11/19/2018    Patient Active Problem List   Diagnosis Date Noted   Sickle cell trait (HCC) 11/19/2018   Alpha thalassemia silent carrier 11/19/2018    Past Surgical History:  Procedure Laterality Date   NO PAST SURGERIES      OB History     Gravida  2   Para  1   Term  1   Preterm      AB      Living  1      SAB      IAB      Ectopic      Multiple  0   Live Births  1            Home Medications    Prior to Admission medications   Medication Sig Start Date End Date Taking? Authorizing Provider  metroNIDAZOLE (FLAGYL) 500 MG tablet Take 1 tablet (500 mg total) by mouth 2 (two) times daily for 7 days. 11/14/22 11/21/22 Yes Morene Cecilio, Lurena Joiner, PA-C  Blood Pressure Monitoring (BLOOD PRESSURE CUFF) MISC 1 Device by Does not apply route once a week. For weekly monitoring of blood pressure Patient not taking: Reported on 05/04/2019 11/06/18 02/17/20  Tereso Newcomer, MD    Family History Family History  Problem Relation Age of Onset   Healthy Mother    Healthy Father     Social History Social History   Tobacco Use   Smoking status: Former   Smokeless tobacco: Never  Building services engineer Use: Never used  Substance Use Topics   Alcohol use: Not Currently   Drug use: Not Currently    Types: Marijuana     Allergies    Patient has no known allergies.   Review of Systems Review of Systems As per HPI  Physical Exam Triage Vital Signs ED Triage Vitals  Enc Vitals Group     BP 11/14/22 1739 116/83     Pulse Rate 11/14/22 1739 60     Resp 11/14/22 1739 18     Temp 11/14/22 1739 97.9 F (36.6 C)     Temp Source 11/14/22 1739 Oral     SpO2 11/14/22 1739 98 %     Weight --      Height --      Head Circumference --      Peak Flow --      Pain Score 11/14/22 1736 5     Pain Loc --      Pain Edu? --      Excl. in GC? --    No data found.  Updated Vital Signs BP 116/83 (BP Location: Right Arm)   Pulse 60   Temp 97.9 F (36.6 C) (Oral)   Resp 18   LMP 11/02/2022   SpO2 98%  Physical Exam Vitals and nursing note reviewed.  Constitutional:      General: She is not in acute distress.    Appearance: Normal appearance.  HENT:     Mouth/Throat:     Pharynx: Oropharynx is clear.  Cardiovascular:     Rate and Rhythm: Normal rate and regular rhythm.     Pulses: Normal pulses.     Heart sounds: Normal heart sounds.  Pulmonary:     Effort: Pulmonary effort is normal.     Breath sounds: Normal breath sounds.  Abdominal:     Palpations: Abdomen is soft.  Neurological:     Mental Status: She is alert and oriented to person, place, and time.     UC Treatments / Results  Labs (all labs ordered are listed, but only abnormal results are displayed) Labs Reviewed  POCT URINE PREGNANCY  CERVICOVAGINAL ANCILLARY ONLY    EKG  Radiology No results found.  Procedures Procedures (including critical care time)  Medications Ordered in UC Medications - No data to display  Initial Impression / Assessment and Plan / UC Course  I have reviewed the triage vital signs and the nursing notes.  Pertinent labs & imaging results that were available during my care of the patient were reviewed by me and considered in my medical decision making (see chart for details).  UPT negative Cytology  swab pending. Will treat for BV given history of this and reporting similar symptoms. Flagyl BID x 7 days. Side effects discussed.  No questions at this time Recommend follow with ob/gym  Final Clinical Impressions(s) / UC Diagnoses   Final diagnoses:  Vaginal discharge  Bacterial vaginosis     Discharge Instructions      We will call you if anything on your swab returns positive. Please abstain from sexual intercourse until your results return.  I am treating you for BV. Take the flagyl twice daily for 7 days. Take with food to avoid upset stomach. Do not drink alcohol while using this medication.  Please follow up with an ob/gyn     ED Prescriptions     Medication Sig Dispense Auth. Provider   metroNIDAZOLE (FLAGYL) 500 MG tablet Take 1 tablet (500 mg total) by mouth 2 (two) times daily for 7 days. 14 tablet [employer] Mcjunkins, Lurena Joiner, PA-C      PDMP not reviewed this encounter.   Kathrine Haddock 11/14/22 1610

## 2022-11-15 LAB — CERVICOVAGINAL ANCILLARY ONLY
Bacterial Vaginitis (gardnerella): POSITIVE — AB
Candida Glabrata: NEGATIVE
Candida Vaginitis: NEGATIVE
Chlamydia: NEGATIVE
Comment: NEGATIVE
Comment: NEGATIVE
Comment: NEGATIVE
Comment: NEGATIVE
Comment: NEGATIVE
Comment: NORMAL
Neisseria Gonorrhea: NEGATIVE
Trichomonas: POSITIVE — AB

## 2023-01-01 ENCOUNTER — Ambulatory Visit: Payer: Self-pay | Admitting: *Deleted

## 2023-01-01 NOTE — Telephone Encounter (Signed)
Summary: discharge and odor 1 week   Discharge and odor  1 week   patient states she had these symptoms  before with the same person, received medication for it. Then she had relations again with him and the same symptom have returned.     Reason for Disposition  Bad smelling vaginal discharge  Answer Assessment - Initial Assessment Questions 1. DISCHARGE: "Describe the discharge." (e.g., white, yellow, green, gray, foamy, cottage cheese-like)     Discharge-light yellow,  odor 2. ODOR: "Is there a bad odor?"     yes 3. ONSET: "When did the discharge begin?"     1 week 4. RASH: "Is there a rash in the genital area?" If Yes, ask: "Describe it." (e.g., redness, blisters, sores, bumps)     no 5. ABDOMEN PAIN: "Are you having any abdomen pain?" If Yes, ask: "What does it feel like? " (e.g., crampy, dull, intermittent, constant)      no 6. ABDOMEN PAIN SEVERITY: If present, ask: "How bad is it?" (e.g., Scale 1-10; mild, moderate, or severe)   - MILD (1-3): Doesn't interfere with normal activities, abdomen soft and not tender to touch.    - MODERATE (4-7): Interferes with normal activities or awakens from sleep, abdomen tender to touch.    - SEVERE (8-10): Excruciating pain, doubled over, unable to do any normal activities. (R/O peritonitis)      no 7. CAUSE: "What do you think is causing the discharge?" "Have you had the same problem before? What happened then?"     STD exposure- BV, Trich 8. OTHER SYMPTOMS: "Do you have any other symptoms?" (e.g., fever, itching, vaginal bleeding, pain with urination, injury to genital area, vaginal foreign body)     no 9. PREGNANCY: "Is there any chance you are pregnant?" "When was your last menstrual period?"     LMP- due any day  Protocols used: Vaginal Discharge-A-AH

## 2023-01-01 NOTE — Telephone Encounter (Signed)
  Chief Complaint: vaginal discharge with odor Symptoms: yellow discharge, odor Frequency: 1 week Pertinent Negatives: Patient denies other symptoms Disposition: [] ED /[x] Urgent Care (no appt availability in office) / [] Appointment(In office/virtual)/ []  [place] [facility]/ [] Home Care/ [] Refused Recommended Disposition /[] [facility] [facility]/ []  Follow-up with PCP Additional Notes: Patient reports she was recently treated for BV/Trich. Patient had intercourse with same partner and feels he may not have been treated. Patient advised to be seen for recheck.  Since patient does not have PCP- UC advised.

## 2023-01-10 ENCOUNTER — Ambulatory Visit (HOSPITAL_COMMUNITY)
Admission: EM | Admit: 2023-01-10 | Discharge: 2023-01-10 | Disposition: A | Discharge status: Home / Self Care | Payer: Medicaid Other | Attending: Physician Assistant | Admitting: Physician Assistant

## 2023-01-10 ENCOUNTER — Encounter (HOSPITAL_COMMUNITY): Payer: Self-pay

## 2023-01-10 DIAGNOSIS — N898 Other specified noninflammatory disorders of vagina: Secondary | ICD-10-CM | POA: Insufficient documentation

## 2023-01-10 DIAGNOSIS — Z202 Contact with and (suspected) exposure to infections with a predominantly sexual mode of transmission: Secondary | ICD-10-CM | POA: Insufficient documentation

## 2023-01-10 MED ORDER — METRONIDAZOLE 500 MG PO TABS: 500.0000 mg | ORAL_TABLET | Freq: Two times a day (BID) | ORAL | 0 refills | Status: DC

## 2023-01-10 NOTE — Discharge Instructions (Signed)
We are treating you for bacterial vaginosis and trichomonas.  Take metronidazole twice daily for 7 days.  Do not drink any alcohol on this medication as will cause you to vomit.  Wear loosefitting cotton underwear and use hypoallergenic soaps and detergents.  We will contact you if we need to arrange additional treatment based on your swab result.  Abstain from sex and to receive results.  All partners to be tested and treated as well.  Use a condom to the sexual encounter.  If develop any pelvic pain, abdominal pain, fever, nausea, vomiting you need to be seen immediately.

## 2023-01-10 NOTE — ED Triage Notes (Signed)
Patient states her partner lied and told her he was treated for his STD but he was not. In may Patient tested positive for BV and Trich. Current symptoms onset 2 weeks ago.

## 2023-01-10 NOTE — ED Provider Notes (Signed)
[facility]    CSN: [id number] Arrival date & time: 01/10/23  1731      History   Chief Complaint Chief Complaint  Patient presents with   Exposure to STD    HPI Consuelo Suthers is a 22 y.o. female.   Patient presents today with a 2-week history of recurrent vaginal discharge and odor.  Reports that she was seen in May 2024 which were treated for positive trichomonas and bacterial vaginosis.  She did complete course of treatment and had resolution of symptoms.  She informed her partner and was under the impression that they had been tested and treated as well but after an unprotected encounter she then developed symptoms.  She denies any pelvic pain, abdominal pain, fever, nausea, vomiting.  Denies any changes to personal hygiene products including soaps or detergents.  Denies any antibiotic use in the past 90 days with the exception of metronidazole prescribed at her last visit.  She is confident that she is not pregnant.    Past Medical History:  Diagnosis Date   Alpha thalassemia silent carrier 11/19/2018   Medical history non-contributory    Sickle cell trait (HCC) 11/19/2018    Patient Active Problem List   Diagnosis Date Noted   Sickle cell trait (HCC) 11/19/2018   Alpha thalassemia silent carrier 11/19/2018    Past Surgical History:  Procedure Laterality Date   NO PAST SURGERIES      OB History     Gravida  2   Para  1   Term  1   Preterm      AB      Living  1      SAB      IAB      Ectopic      Multiple  0   Live Births  1            Home Medications    Prior to Admission medications   Medication Sig Start Date End Date Taking? Authorizing Provider  metroNIDAZOLE (FLAGYL) 500 MG tablet Take 1 tablet (500 mg total) by mouth 2 (two) times daily. 01/10/23  Yes Deysy Schabel, Noberto Retort, PA-C  Blood Pressure Monitoring (BLOOD PRESSURE CUFF) MISC 1 Device by Does not apply route once a week. For weekly monitoring of blood  pressure Patient not taking: Reported on 05/04/2019 11/06/18 02/17/20  Tereso Newcomer, MD    Family History Family History  Problem Relation Age of Onset   Healthy Mother    Healthy Father     Social History Social History   Tobacco Use   Smoking status: Former   Smokeless tobacco: Never  Advertising account planner   Vaping status: Never Used  Substance Use Topics   Alcohol use: Not Currently   Drug use: Not Currently    Types: Marijuana     Allergies   Patient has no known allergies.   Review of Systems Review of Systems  Constitutional:  Negative for activity change, appetite change, fatigue and fever.  Gastrointestinal:  Negative for abdominal pain, diarrhea, nausea and vomiting.  Genitourinary:  Positive for vaginal discharge. Negative for dysuria, frequency, urgency, vaginal bleeding and vaginal pain.     Physical Exam Triage Vital Signs ED Triage Vitals  Encounter Vitals Group     BP 01/10/23 1852 111/73     Systolic BP Percentile --      Diastolic BP Percentile --      Pulse Rate 01/10/23 1852 74     Resp 01/10/23  1852 16     Temp 01/10/23 1852 98.2 F (36.8 C)     Temp Source 01/10/23 1852 Oral     SpO2 01/10/23 1852 97 %     Weight 01/10/23 1851 188 lb (85.3 kg)     Height 01/10/23 1851 5' 10.5" (1.791 m)     Head Circumference --      Peak Flow --      Pain Score 01/10/23 1849 0     Pain Loc --      Pain Education --      Exclude from Growth Chart --    No data found.  Updated Vital Signs BP 111/73 (BP Location: Left Arm)   Pulse 74   Temp 98.2 F (36.8 C) (Oral)   Resp 16   Ht 5' 10.5" (1.791 m)   Wt 188 lb (85.3 kg)   LMP 01/02/2023 (Approximate)   SpO2 97%   BMI 26.59 kg/m   Visual Acuity Right Eye Distance:   Left Eye Distance:   Bilateral Distance:    Right Eye Near:   Left Eye Near:    Bilateral Near:     Physical Exam Vitals reviewed.  Constitutional:      General: She is awake. She is not in acute distress.    Appearance:  Normal appearance. She is well-developed. She is not ill-appearing.     Comments: Very pleasant female appears stated age in no acute distress  HENT:     Head: Normocephalic and atraumatic.  Cardiovascular:     Rate and Rhythm: Normal rate and regular rhythm.     Heart sounds: Normal heart sounds, S1 normal and S2 normal. No murmur heard. Pulmonary:     Effort: Pulmonary effort is normal.     Breath sounds: Normal breath sounds. No wheezing, rhonchi or rales.     Comments: Clear auscultation bilaterally Abdominal:     General: Bowel sounds are normal.     Palpations: Abdomen is soft.     Tenderness: There is no abdominal tenderness. There is no right CVA tenderness, left CVA tenderness, guarding or rebound.     Comments: Benign abdominal exam  Psychiatric:        Behavior: Behavior is cooperative.      UC Treatments / Results  Labs (all labs ordered are listed, but only abnormal results are displayed) Labs Reviewed  CERVICOVAGINAL ANCILLARY ONLY    EKG   Radiology No results found.  Procedures Procedures (including critical care time)  Medications Ordered in UC Medications - No data to display  Initial Impression / Assessment and Plan / UC Course  I have reviewed the triage vital signs and the nursing notes.  Pertinent labs & imaging results that were available during my care of the patient were reviewed by me and considered in my medical decision making (see chart for details).     Patient is well-appearing, afebrile, nontoxic, nontachycardic.  Vital signs and physical exam are reassuring today with no indication for emergent evaluation or imaging.  Given her clinical presentation concern for exposure to trichomonas we will treat with metronidazole 500 mg twice daily for 7 days.  Discussed that she is not to drink any alcohol on this medication due to associated Antabuse side effects.  STI swab was collected and is pending.  We will contact her if any to arrange  additional treatment.  Offered HIV and syphilis testing which she declined.  She is to wear loosefitting cotton underwear and use hypoallergenic soaps  and detergents.  Discussed that if she develops any worsening symptoms she needs to be seen immediately including abdominal pain, pelvic pain, fever, nausea, vomiting.  Strict turn precautions given.  Final Clinical Impressions(s) / UC Diagnoses   Final diagnoses:  Vaginal discharge  Vaginal odor  Exposure to trichomonas     Discharge Instructions      We are treating you for bacterial vaginosis and trichomonas.  Take metronidazole twice daily for 7 days.  Do not drink any alcohol on this medication as will cause you to vomit.  Wear loosefitting cotton underwear and use hypoallergenic soaps and detergents.  We will contact you if we need to arrange additional treatment based on your swab result.  Abstain from sex and to receive results.  All partners to be tested and treated as well.  Use a condom to the sexual encounter.  If develop any pelvic pain, abdominal pain, fever, nausea, vomiting you need to be seen immediately.    ED Prescriptions     Medication Sig Dispense Auth. Provider   metroNIDAZOLE (FLAGYL) 500 MG tablet Take 1 tablet (500 mg total) by mouth 2 (two) times daily. 14 tablet Rashauna Tep, Noberto Retort, PA-C      PDMP not reviewed this encounter.   Jeani Hawking, PA-C 01/10/23 1925

## 2023-01-14 LAB — CERVICOVAGINAL ANCILLARY ONLY
Bacterial Vaginitis (gardnerella): POSITIVE — AB
Candida Glabrata: NEGATIVE
Candida Vaginitis: NEGATIVE
Chlamydia: POSITIVE — AB
Comment: NEGATIVE
Comment: NEGATIVE
Comment: NEGATIVE
Comment: NEGATIVE
Comment: NEGATIVE
Comment: NORMAL
Neisseria Gonorrhea: NEGATIVE
Trichomonas: NEGATIVE

## 2023-01-15 ENCOUNTER — Telehealth (HOSPITAL_COMMUNITY): Payer: Self-pay | Admitting: Emergency Medicine

## 2023-01-15 MED ORDER — DOXYCYCLINE HYCLATE 100 MG PO CAPS: 100.0000 mg | ORAL_CAPSULE | Freq: Two times a day (BID) | ORAL | 0 refills | Status: AC

## 2023-02-06 ENCOUNTER — Ambulatory Visit (HOSPITAL_COMMUNITY): Admission: RE | Admit: 2023-02-06 | Payer: Medicaid Other | Source: Ambulatory Visit

## 2023-02-06 ENCOUNTER — Other Ambulatory Visit: Payer: Self-pay

## 2023-02-06 ENCOUNTER — Emergency Department (HOSPITAL_COMMUNITY)
Admission: EM | Admit: 2023-02-06 | Discharge: 2023-02-06 | Discharge status: Against Medical Advice | Payer: Medicaid Other | Attending: Emergency Medicine | Admitting: Emergency Medicine

## 2023-02-06 ENCOUNTER — Encounter (HOSPITAL_COMMUNITY): Payer: Self-pay

## 2023-02-06 DIAGNOSIS — R102 Pelvic and perineal pain: Secondary | ICD-10-CM | POA: Diagnosis not present

## 2023-02-06 DIAGNOSIS — Z3201 Encounter for pregnancy test, result positive: Secondary | ICD-10-CM

## 2023-02-06 DIAGNOSIS — O26891 Other specified pregnancy related conditions, first trimester: Secondary | ICD-10-CM | POA: Diagnosis present

## 2023-02-06 LAB — URINALYSIS, ROUTINE W REFLEX MICROSCOPIC
Bilirubin Urine: NEGATIVE
Glucose, UA: NEGATIVE mg/dL
Hgb urine dipstick: NEGATIVE
Ketones, ur: NEGATIVE mg/dL
Nitrite: NEGATIVE
Protein, ur: NEGATIVE mg/dL
Specific Gravity, Urine: 1.023 (ref 1.005–1.030)
pH: 5 (ref 5.0–8.0)

## 2023-02-06 LAB — CBC WITH DIFFERENTIAL/PLATELET
Abs Immature Granulocytes: 0.01 10*3/uL (ref 0.00–0.07)
Basophils Absolute: 0 10*3/uL (ref 0.0–0.1)
Basophils Relative: 1 %
Eosinophils Absolute: 0.1 10*3/uL (ref 0.0–0.5)
Eosinophils Relative: 1 %
HCT: 38.5 % (ref 36.0–46.0)
Hemoglobin: 13 g/dL (ref 12.0–15.0)
Immature Granulocytes: 0 %
Lymphocytes Relative: 38 %
Lymphs Abs: 2.1 10*3/uL (ref 0.7–4.0)
MCH: 28.1 pg (ref 26.0–34.0)
MCHC: 33.8 g/dL (ref 30.0–36.0)
MCV: 83.3 fL (ref 80.0–100.0)
Monocytes Absolute: 0.6 10*3/uL (ref 0.1–1.0)
Monocytes Relative: 10 %
Neutro Abs: 2.8 10*3/uL (ref 1.7–7.7)
Neutrophils Relative %: 50 %
Platelets: 259 10*3/uL (ref 150–400)
RBC: 4.62 MIL/uL (ref 3.87–5.11)
RDW: 13.6 % (ref 11.5–15.5)
WBC: 5.6 10*3/uL (ref 4.0–10.5)
nRBC: 0 % (ref 0.0–0.2)

## 2023-02-06 LAB — PREGNANCY, URINE: Preg Test, Ur: POSITIVE — AB

## 2023-02-06 LAB — COMPREHENSIVE METABOLIC PANEL
ALT: 14 U/L (ref 0–44)
AST: 16 U/L (ref 15–41)
Albumin: 4.2 g/dL (ref 3.5–5.0)
Alkaline Phosphatase: 55 U/L (ref 38–126)
Anion gap: 8 (ref 5–15)
BUN: 9 mg/dL (ref 6–20)
CO2: 22 mmol/L (ref 22–32)
Calcium: 8.8 mg/dL — ABNORMAL LOW (ref 8.9–10.3)
Chloride: 104 mmol/L (ref 98–111)
Creatinine, Ser: 0.82 mg/dL (ref 0.44–1.00)
GFR, Estimated: >60 mL/min (ref >60)
Glucose, Bld: 80 mg/dL (ref 70–99)
Potassium: 3.8 mmol/L (ref 3.5–5.1)
Sodium: 134 mmol/L — ABNORMAL LOW (ref 135–145)
Total Bilirubin: 0.6 mg/dL (ref 0.3–1.2)
Total Protein: 7.3 g/dL (ref 6.5–8.1)

## 2023-02-06 LAB — LIPASE, BLOOD: Lipase: 26 U/L (ref 11–51)

## 2023-02-06 NOTE — ED Provider Notes (Signed)
[place] EMERGENCY DEPARTMENT AT [facility] Provider Note   CSN: [id number] Arrival date & time: 02/06/23  1431     History  Chief Complaint  Patient presents with   Abdominal Cramping    Cathy Weber is a 22 y.o. female with a past medical history significant for sickle cell trait and alpha thalassemia carrier who presents to the ED due to pelvic cramping that has been intermittent for the past week.  Patient also notes she is 4 days late for her menstrual cycle.  Patient is concerned she may be pregnant. G2P1.  Denies any vaginal discharge or urinary symptoms.  No nausea, vomiting, or diarrhea.  Patient states her cycles are typically regular and was supposed to start on 8/10.  Currently sexually active.  History obtained from patient and past medical records. No interpreter used during encounter.       Home Medications Prior to Admission medications   Medication Sig Start Date End Date Taking? Authorizing Provider  metroNIDAZOLE (FLAGYL) 500 MG tablet Take 1 tablet (500 mg total) by mouth 2 (two) times daily. 01/10/23   Raspet, Noberto Retort, PA-C  Blood Pressure Monitoring (BLOOD PRESSURE CUFF) MISC 1 Device by Does not apply route once a week. For weekly monitoring of blood pressure Patient not taking: Reported on 05/04/2019 11/06/18 02/17/20  Tereso Newcomer, MD      Allergies    Patient has no known allergies.    Review of Systems   Review of Systems  Gastrointestinal:  Positive for abdominal pain.  Genitourinary:  Positive for pelvic pain. Negative for dysuria and vaginal discharge.    Physical Exam Updated Vital Signs BP 120/89 (BP Location: Left Arm)   Pulse 82   Temp 97.6 F (36.4 C) (Axillary)   Resp 16   Ht 5' 10.5" (1.791 m)   Wt 87.5 kg   LMP 01/02/2023 (Approximate)   SpO2 100%   BMI 27.30 kg/m  Physical Exam Vitals and nursing note reviewed.  Constitutional:      General: She is not in acute distress.    Appearance: She is not  ill-appearing.  HENT:     Head: Normocephalic.  Eyes:     Pupils: Pupils are equal, round, and reactive to light.  Cardiovascular:     Rate and Rhythm: Normal rate and regular rhythm.     Pulses: Normal pulses.     Heart sounds: Normal heart sounds. No murmur heard.    No friction rub. No gallop.  Pulmonary:     Effort: Pulmonary effort is normal.     Breath sounds: Normal breath sounds.  Abdominal:     General: Abdomen is flat. There is no distension.     Palpations: Abdomen is soft.     Tenderness: There is no abdominal tenderness. There is no guarding or rebound.     Comments: Abdomen soft, nondistended, nontender to palpation in all quadrants without guarding or peritoneal signs. No rebound.   Musculoskeletal:        General: Normal range of motion.     Cervical back: Neck supple.  Skin:    General: Skin is warm and dry.  Neurological:     General: No focal deficit present.     Mental Status: She is alert.  Psychiatric:        Mood and Affect: Mood normal.        Behavior: Behavior normal.     ED Results / Procedures / Treatments   Labs (all labs  ordered are listed, but only abnormal results are displayed) Labs Reviewed  COMPREHENSIVE METABOLIC PANEL - Abnormal; Notable for the following components:      Result Value   Sodium 134 (*)    Calcium 8.8 (*)    All other components within normal limits  PREGNANCY, URINE - Abnormal; Notable for the following components:   Preg Test, Ur POSITIVE (*)    All other components within normal limits  CBC WITH DIFFERENTIAL/PLATELET  LIPASE, BLOOD  URINALYSIS, ROUTINE W REFLEX MICROSCOPIC  POC URINE PREG, ED    EKG None  Radiology No results found.  Procedures Procedures    Medications Ordered in ED Medications - No data to display  ED Course/ Medical Decision Making/ A&P Clinical Course as of 02/06/23 1903  Wed Feb 06, 2023  1808 Preg Test, Ur(!): POSITIVE [CA]    Clinical Course User Index [CA] Mannie Stabile, PA-C                                 Medical Decision Making Amount and/or Complexity of Data Reviewed Labs: ordered. Decision-making details documented in ED Course. Radiology: ordered and independent interpretation performed. Decision-making details documented in ED Course.   This patient presents to the ED for concern of pelvic pain, this involves an extensive number of treatment options, and is a complaint that carries with it a high risk of complications and morbidity.  The differential diagnosis includes ectopic pregnancy, menstrual cramping, PID, etc  22 year old female presents to the ED due to lower abdominal cramping x 1 week.  Patient is late for her menstrual cycle by 4 days.  Denies vaginal discharge and dysuria.  Patient is concerned she may be pregnant.  Upon arrival, vitals all within normal limits.  Patient in no acute distress.  Abdomen soft, nondistended, nontender.  No tenderness in suprapubic region.  Routine labs ordered.  Pregnancy test. Will hold off on imaging at this time. Low suspicion for acute abdomen. If pregnancy test is positive, will obtain US, if negative will hold off. Low suspicion for ovarian torsion. Low suspicion for PID. Possible pre-menstrual cramping if pregnancy test positive?  CBC reassuring.  No leukocytosis.  Normal hemoglobin.  Lipase normal.  CMP reassuring.  Normal renal function.  Pregnancy test positive.  Patient declined quantitative hCG. [place] ordered to rule out ectopic pregnancy given pelvic pain.   6:59 PM informed by RN that patient had eloped from the ED prior to ultrasound.  Spoke to patient on the phone.  She notes she had to leave the ED because she had somewhere to be.  Informed patient that I am unable to rule out ectopic pregnancy given she did not obtain her ultrasound.  Recommended patient return to the ED or follow-up with OB/GYN for further evaluation.  Patient was made aware that her pregnancy test was positive.  Lives at  home No PCP       Final Clinical Impression(s) / ED Diagnoses Final diagnoses:  None    Rx / DC Orders ED Discharge Orders     None         Jesusita Oka 02/06/23 1904    Gwyneth Sprout, MD 02/07/23 1343

## 2023-02-06 NOTE — ED Notes (Signed)
Pt missing from room and unable to be located. Several attempts to call pt, but phone number provided was disconnected.

## 2023-02-06 NOTE — ED Triage Notes (Signed)
Pt coming in complaining of "mild cramps" in her pelvic area that has been going for apprx 1 wk. Pt also states she has not had her cycle in four days, pt states that it is possible she is pregnant.

## 2023-02-07 ENCOUNTER — Emergency Department (HOSPITAL_COMMUNITY)
Admission: EM | Admit: 2023-02-07 | Discharge: 2023-02-07 | Disposition: A | Discharge status: Home / Self Care | Payer: Medicaid Other | Attending: Emergency Medicine | Admitting: Emergency Medicine

## 2023-02-07 ENCOUNTER — Encounter (HOSPITAL_COMMUNITY): Payer: Self-pay | Admitting: Emergency Medicine

## 2023-02-07 ENCOUNTER — Other Ambulatory Visit: Payer: Self-pay

## 2023-02-07 ENCOUNTER — Emergency Department (HOSPITAL_COMMUNITY): Payer: Medicaid Other

## 2023-02-07 DIAGNOSIS — O3680X0 Pregnancy with inconclusive fetal viability, not applicable or unspecified: Secondary | ICD-10-CM

## 2023-02-07 DIAGNOSIS — R1030 Lower abdominal pain, unspecified: Secondary | ICD-10-CM | POA: Diagnosis not present

## 2023-02-07 DIAGNOSIS — O26891 Other specified pregnancy related conditions, first trimester: Secondary | ICD-10-CM | POA: Diagnosis not present

## 2023-02-07 DIAGNOSIS — O99281 Endocrine, nutritional and metabolic diseases complicating pregnancy, first trimester: Secondary | ICD-10-CM | POA: Insufficient documentation

## 2023-02-07 DIAGNOSIS — Z3A01 Less than 8 weeks gestation of pregnancy: Secondary | ICD-10-CM | POA: Insufficient documentation

## 2023-02-07 DIAGNOSIS — E876 Hypokalemia: Secondary | ICD-10-CM | POA: Diagnosis not present

## 2023-02-07 LAB — COMPREHENSIVE METABOLIC PANEL
ALT: 14 U/L (ref 0–44)
AST: 15 U/L (ref 15–41)
Albumin: 3.9 g/dL (ref 3.5–5.0)
Alkaline Phosphatase: 50 U/L (ref 38–126)
Anion gap: 7 (ref 5–15)
BUN: 10 mg/dL (ref 6–20)
CO2: 22 mmol/L (ref 22–32)
Calcium: 8.8 mg/dL — ABNORMAL LOW (ref 8.9–10.3)
Chloride: 105 mmol/L (ref 98–111)
Creatinine, Ser: 0.85 mg/dL (ref 0.44–1.00)
GFR, Estimated: >60 mL/min (ref >60)
Glucose, Bld: 89 mg/dL (ref 70–99)
Potassium: 3.4 mmol/L — ABNORMAL LOW (ref 3.5–5.1)
Sodium: 134 mmol/L — ABNORMAL LOW (ref 135–145)
Total Bilirubin: 0.7 mg/dL (ref 0.3–1.2)
Total Protein: 7.1 g/dL (ref 6.5–8.1)

## 2023-02-07 LAB — CBC WITH DIFFERENTIAL/PLATELET
Abs Immature Granulocytes: 0.01 10*3/uL (ref 0.00–0.07)
Basophils Absolute: 0 10*3/uL (ref 0.0–0.1)
Basophils Relative: 1 %
Eosinophils Absolute: 0.1 10*3/uL (ref 0.0–0.5)
Eosinophils Relative: 1 %
HCT: 38.3 % (ref 36.0–46.0)
Hemoglobin: 13 g/dL (ref 12.0–15.0)
Immature Granulocytes: 0 %
Lymphocytes Relative: 42 %
Lymphs Abs: 2.4 10*3/uL (ref 0.7–4.0)
MCH: 27.9 pg (ref 26.0–34.0)
MCHC: 33.9 g/dL (ref 30.0–36.0)
MCV: 82.2 fL (ref 80.0–100.0)
Monocytes Absolute: 0.6 10*3/uL (ref 0.1–1.0)
Monocytes Relative: 10 %
Neutro Abs: 2.7 10*3/uL (ref 1.7–7.7)
Neutrophils Relative %: 46 %
Platelets: 255 10*3/uL (ref 150–400)
RBC: 4.66 MIL/uL (ref 3.87–5.11)
RDW: 13.3 % (ref 11.5–15.5)
WBC: 5.8 10*3/uL (ref 4.0–10.5)
nRBC: 0 % (ref 0.0–0.2)

## 2023-02-07 LAB — HCG, QUANTITATIVE, PREGNANCY: hCG, Beta Chain, Quant, S: 1104 m[IU]/mL — ABNORMAL HIGH (ref <5)

## 2023-02-07 MED ORDER — FOSFOMYCIN TROMETHAMINE 3 G PO PACK
3.0000 g | PACK | Freq: Once | ORAL | Status: AC
  Administered 2023-02-07: 3 g via ORAL
  Filled 2023-02-07: qty 3

## 2023-02-07 MED ORDER — AZITHROMYCIN 250 MG PO TABS
1000.0000 mg | ORAL_TABLET | Freq: Once | ORAL | Status: AC
  Administered 2023-02-07: 1000 mg via ORAL
  Filled 2023-02-07: qty 4

## 2023-02-07 NOTE — ED Triage Notes (Signed)
Patient was seen yesterday, but left AMA. She later received a call to come back for an [place] and blood work was abnormal. There is a concern for ectopic pregnancy. She still complains of pelvic cramping.

## 2023-02-07 NOTE — ED Provider Notes (Signed)
[place] EMERGENCY DEPARTMENT AT [facility] Provider Note   CSN: [id number] Arrival date & time: 02/07/23  1555     History  Chief Complaint  Patient presents with   Pelvic Pain    Cathy Weber is a 22 y.o. female.  HPI     22 year old female G71P1011 with a history of alpha thalassemia silent carrier, sickle cell trait, emergency department visit yesterday for abdominal cramping.  LMP 7/10  She reported intermittent abdominal cramping for the last week, noted she was 4 days late for her menstrual cycle and concern she could be pregnant.  She denied vaginal discharge, urinary symptoms, nausea, vomiting or diarrhea.  She was evaluated including labs that showed pregnancy test was positive, and an ultrasound was ordered, however she eloped prior to receiving her ultrasound because she had somewhere to go.  She returns given concern of abdominal pain, to obtain the ultrasound that was ordered yesterday.  Reports she tested positive for chlamydia a few weeks ago but did not fill the doxycycline and is not sure if she should take it now that she is pregnant. Denies vaginal discharge or bleeding.  O+  Past Medical History:  Diagnosis Date   Alpha thalassemia silent carrier 11/19/2018   Medical history non-contributory    Sickle cell trait (HCC) 11/19/2018    Home Medications Prior to Admission medications   Medication Sig Start Date End Date Taking? Authorizing Provider  metroNIDAZOLE (FLAGYL) 500 MG tablet Take 1 tablet (500 mg total) by mouth 2 (two) times daily. 01/10/23   Raspet, Noberto Retort, PA-C  Blood Pressure Monitoring (BLOOD PRESSURE CUFF) MISC 1 Device by Does not apply route once a week. For weekly monitoring of blood pressure Patient not taking: Reported on 05/04/2019 11/06/18 02/17/20  Tereso Newcomer, MD      Allergies    Patient has no known allergies.    Review of Systems   Review of Systems  Physical Exam Updated Vital Signs BP 114/73   Pulse 90    Temp 98.6 F (37 C) (Oral)   Resp 18   LMP 01/02/2023 (Approximate)   SpO2 100%  Physical Exam Vitals and nursing note reviewed.  Constitutional:      General: She is not in acute distress.    Appearance: Normal appearance. She is not ill-appearing, toxic-appearing or diaphoretic.  HENT:     Head: Normocephalic.  Eyes:     Conjunctiva/sclera: Conjunctivae normal.  Cardiovascular:     Rate and Rhythm: Normal rate and regular rhythm.     Pulses: Normal pulses.  Pulmonary:     Effort: Pulmonary effort is normal. No respiratory distress.  Abdominal:     Tenderness: There is abdominal tenderness (mild lower abdomen).  Musculoskeletal:        General: No deformity or signs of injury.     Cervical back: No rigidity.  Skin:    General: Skin is warm and dry.     Coloration: Skin is not jaundiced or pale.  Neurological:     General: No focal deficit present.     Mental Status: She is alert and oriented to person, place, and time.     ED Results / Procedures / Treatments   Labs (all labs ordered are listed, but only abnormal results are displayed) Labs Reviewed  COMPREHENSIVE METABOLIC PANEL - Abnormal; Notable for the following components:      Result Value   Sodium 134 (*)    Potassium 3.4 (*)    Calcium  8.8 (*)    All other components within normal limits  HCG, QUANTITATIVE, PREGNANCY - Abnormal; Notable for the following components:   hCG, Beta Chain, Quant, S 1,104 (*)    All other components within normal limits  CBC WITH DIFFERENTIAL/PLATELET    EKG None  Radiology US OB Comp < 14 Wks  Result Date: 02/07/2023 CLINICAL DATA:  Pelvic pain and positive pregnancy test EXAM: OBSTETRIC <14 WK ULTRASOUND TECHNIQUE: Transabdominal ultrasound was performed for evaluation of the gestation as well as the maternal uterus and adnexal regions. COMPARISON:  None Available. FINDINGS: Intrauterine gestational sac: Absent Maternal uterus/adnexae: No extra uterine gestational sac  is identified. Prominent endometrium is noted. Ovaries are within normal limits. Mild free fluid is noted within the pelvis which may be physiologic in nature. IMPRESSION: No discrete intrauterine or extra uterine gestation is noted. Recommend follow-up quantitative B-HCG levels and follow-up US in 14 days to assess viability. This recommendation follows SRU consensus guidelines: Diagnostic Criteria for Nonviable Pregnancy Early in the First Trimester. Malva Limes Med 2013; 295:6213-08. Electronically Signed   By: Alcide Clever M.D.   On: 02/07/2023 21:57    Procedures Procedures    Medications Ordered in ED Medications  azithromycin (ZITHROMAX) tablet 1,000 mg (1,000 mg Oral Given 02/07/23 2310)  fosfomycin (MONUROL) packet 3 g (3 g Oral Given 02/07/23 2323)    ED Course/ Medical Decision Making/ A&P                                   22 year old female G3P1011 approximately [redacted] weeks pregnant by LMP with a history of alpha thalassemia silent carrier, sickle cell trait, emergency department visit yesterday for abdominal cramping who presents as she left prior to ultrasound yesterday.  Low suspicion for appendicitis, diverticulitis, PID, SBO, torsion, nephrolithiasis.   Labs completed today and reviewed from yesterday without anemia, no leukocytosis, mild hypokalemia. hCG 1104.  TVUS ordered to evaluate for signs of ectopic pregnancy.  US shows no discrete intra or extrauterine gestation. She is hemodynamically stable.  Discussed pregnancy of unknown location at this time--discussed may be normal early pregnancy, abnormal pregnancy or ectopic. Recommend 48 hour hCG, repeat US.  Had bacteruria yesterday on UA--given dose of fosfomycin.  Given dose of azithromycin for treatment of chlamydia diagnosed 7/18 (was also treated with BV at that time) Discussed possible repeat pelvic but given no discharge, recent test results, feel it is reasonable to treat and have further follow up with OB. Recommend  prenatal vitamins, b6/unisom for nausea, OBGYN follow up.         Final Clinical Impression(s) / ED Diagnoses Final diagnoses:  Abdominal pain during pregnancy in first trimester  Pregnancy of unknown anatomic location    Rx / DC Orders ED Discharge Orders     None         Alvira Monday, MD 02/08/23 2128

## 2023-03-06 ENCOUNTER — Encounter (HOSPITAL_COMMUNITY): Payer: Self-pay | Admitting: *Deleted

## 2023-03-06 ENCOUNTER — Inpatient Hospital Stay (HOSPITAL_COMMUNITY)
Admission: AD | Admit: 2023-03-06 | Discharge: 2023-03-06 | Disposition: A | Discharge status: Home / Self Care | Payer: Medicaid Other | Attending: Obstetrics & Gynecology | Admitting: Obstetrics & Gynecology

## 2023-03-06 DIAGNOSIS — Z3481 Encounter for supervision of other normal pregnancy, first trimester: Secondary | ICD-10-CM | POA: Insufficient documentation

## 2023-03-06 DIAGNOSIS — Z3A09 9 weeks gestation of pregnancy: Secondary | ICD-10-CM | POA: Diagnosis not present

## 2023-03-06 DIAGNOSIS — O3680X0 Pregnancy with inconclusive fetal viability, not applicable or unspecified: Secondary | ICD-10-CM | POA: Diagnosis present

## 2023-03-06 DIAGNOSIS — Z3491 Encounter for supervision of normal pregnancy, unspecified, first trimester: Secondary | ICD-10-CM | POA: Diagnosis not present

## 2023-03-06 NOTE — MAU Note (Signed)
.  Cathy Weber is a 22 y.o. at [redacted]w[redacted]d here in MAU reporting: pt went to [facility] on 8/12 was told she was pregnant but could not see pregnancy on u/s. Pt stated her discharge instructions said to f/u with a repeat u/s in 14 days.  Cathy Weber  is why she is here. Denies any va g bleeding or pain. Reports a clear mucusy discharge.  LMP: 01/02/23 Onset of complaint: 14 days Pain score: 0 Vitals:   03/06/23 1822  BP: 117/71  Pulse: 66  Resp: 18  Temp: (!) 97.5 F (36.4 C)     FHT:n/a Lab orders placed from triage:

## 2023-03-06 NOTE — Discharge Instructions (Signed)
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
## 2023-03-15 ENCOUNTER — Telehealth: Payer: Self-pay

## 2023-03-15 ENCOUNTER — Ambulatory Visit: Payer: Medicaid Other | Admitting: Family Medicine

## 2023-03-15 NOTE — Telephone Encounter (Signed)
Called patient to reschedule missed NEW PATIENT appointment couldn't leave voicemail

## 2023-09-20 ENCOUNTER — Other Ambulatory Visit: Payer: Self-pay

## 2023-09-20 ENCOUNTER — Encounter (HOSPITAL_COMMUNITY): Payer: Self-pay | Admitting: *Deleted

## 2023-09-20 ENCOUNTER — Inpatient Hospital Stay (HOSPITAL_COMMUNITY)
Admission: AD | Admit: 2023-09-20 | Discharge: 2023-09-20 | Disposition: A | Discharge status: Home / Self Care | Attending: Obstetrics and Gynecology | Admitting: Obstetrics and Gynecology

## 2023-09-20 DIAGNOSIS — Z369 Encounter for antenatal screening, unspecified: Secondary | ICD-10-CM | POA: Insufficient documentation

## 2023-09-20 DIAGNOSIS — Z3201 Encounter for pregnancy test, result positive: Secondary | ICD-10-CM

## 2023-09-20 MED ORDER — PRENATAL PLUS 27-1 MG PO TABS: 1.0000 | ORAL_TABLET | Freq: Every day | ORAL | 0 refills | Status: DC

## 2023-09-20 NOTE — MAU Note (Signed)
 Cathy Weber is a 23 y.o. at Unknown here in MAU reporting: she had +HPT approximately 1 week ago that was positive and wants to know how far along she is.  Denies current VB or pain.  LMP: 08/09/2023 Onset of complaint: today Pain score: 0 Vitals:   09/20/23 1738  BP: 110/70  Pulse: 73  Resp: 19  Temp: 98.1 F (36.7 C)  SpO2: 100%     FHT: NA  Lab orders placed from triage: None

## 2023-09-20 NOTE — MAU Provider Note (Signed)
 Confirm Pregnancy     S Ms. Cathy Weber is a 23 y.o. G3P1001 patient who presents to MAU today with complaint of +HPT wanting confirmation of pregnancy.  Pt denies VB, abd pain, vaginal d/c.     O BP 110/70 (BP Location: Right Arm)   Pulse 73   Temp 98.1 F (36.7 C) (Oral)   Resp 19   Ht 5\' 11"  (1.803 m)   Wt 89.4 kg   LMP 08/09/2023   SpO2 100%   Breastfeeding Unknown   BMI 27.49 kg/m  Physical Exam Constitutional:      General: She is not in acute distress.    Appearance: Normal appearance. She is normal weight. She is not ill-appearing.  HENT:     Head: Normocephalic and atraumatic.     Right Ear: External ear normal.     Left Ear: External ear normal.     Nose: Nose normal.     Mouth/Throat:     Mouth: Mucous membranes are moist.  Eyes:     Extraocular Movements: Extraocular movements intact.     Conjunctiva/sclera: Conjunctivae normal.  Cardiovascular:     Rate and Rhythm: Normal rate.  Pulmonary:     Effort: Pulmonary effort is normal. No respiratory distress.  Abdominal:     General: Abdomen is flat. There is no distension.     Palpations: Abdomen is soft.  Musculoskeletal:        General: No swelling. Normal range of motion.     Cervical back: Normal range of motion.  Skin:    General: Skin is warm and dry.  Neurological:     Mental Status: She is alert and oriented to person, place, and time. Mental status is at baseline.  Psychiatric:        Mood and Affect: Mood normal.        Behavior: Behavior normal.     AP Instructed that unfortunately we do not offer pregnancy confirmations here at MAU. Pt understanding and agreeable. Pt unsure if would like to continue with pregnancy or not, would like PNV sent.  PNV sent to desired pharmacy  Discharge from MAU in stable condition Patient given the option of seeking care in outpatient facility of choice  List of options for follow-up given  Warning signs for worsening condition that would warrant  emergency follow-up discussed Patient may return to MAU as needed   Hessie Dibble, MD 09/20/2023 5:47 PM

## 2023-11-03 ENCOUNTER — Inpatient Hospital Stay (HOSPITAL_COMMUNITY)

## 2023-11-03 ENCOUNTER — Encounter (HOSPITAL_COMMUNITY)
Admission: EM | Disposition: A | Discharge status: Home Health | Payer: Self-pay | Source: Home / Self Care | Attending: Orthopedic Surgery

## 2023-11-03 ENCOUNTER — Emergency Department (HOSPITAL_COMMUNITY)

## 2023-11-03 ENCOUNTER — Other Ambulatory Visit: Payer: Self-pay

## 2023-11-03 ENCOUNTER — Inpatient Hospital Stay (HOSPITAL_COMMUNITY)
Admission: EM | Admit: 2023-11-03 | Discharge: 2023-11-06 | DRG: 492 | Disposition: A | Discharge status: Home Health | Attending: Orthopedic Surgery | Admitting: Orthopedic Surgery

## 2023-11-03 ENCOUNTER — Encounter (HOSPITAL_COMMUNITY): Payer: Self-pay | Admitting: Orthopedic Surgery

## 2023-11-03 DIAGNOSIS — S82391B Other fracture of lower end of right tibia, initial encounter for open fracture type I or II: Principal | ICD-10-CM | POA: Diagnosis present

## 2023-11-03 DIAGNOSIS — D573 Sickle-cell trait: Secondary | ICD-10-CM | POA: Diagnosis present

## 2023-11-03 DIAGNOSIS — S82831B Other fracture of upper and lower end of right fibula, initial encounter for open fracture type I or II: Secondary | ICD-10-CM | POA: Diagnosis present

## 2023-11-03 DIAGNOSIS — Y9289 Other specified places as the place of occurrence of the external cause: Secondary | ICD-10-CM

## 2023-11-03 DIAGNOSIS — S82201B Unspecified fracture of shaft of right tibia, initial encounter for open fracture type I or II: Secondary | ICD-10-CM | POA: Diagnosis present

## 2023-11-03 DIAGNOSIS — E876 Hypokalemia: Secondary | ICD-10-CM | POA: Diagnosis present

## 2023-11-03 DIAGNOSIS — F10120 Alcohol abuse with intoxication, uncomplicated: Secondary | ICD-10-CM | POA: Diagnosis present

## 2023-11-03 DIAGNOSIS — W3400XA Accidental discharge from unspecified firearms or gun, initial encounter: Secondary | ICD-10-CM

## 2023-11-03 DIAGNOSIS — S81801A Unspecified open wound, right lower leg, initial encounter: Secondary | ICD-10-CM

## 2023-11-03 DIAGNOSIS — Z23 Encounter for immunization: Secondary | ICD-10-CM | POA: Diagnosis not present

## 2023-11-03 DIAGNOSIS — F10920 Alcohol use, unspecified with intoxication, uncomplicated: Secondary | ICD-10-CM

## 2023-11-03 DIAGNOSIS — E872 Acidosis, unspecified: Secondary | ICD-10-CM | POA: Diagnosis present

## 2023-11-03 DIAGNOSIS — Z87891 Personal history of nicotine dependence: Secondary | ICD-10-CM | POA: Diagnosis not present

## 2023-11-03 DIAGNOSIS — R7989 Other specified abnormal findings of blood chemistry: Secondary | ICD-10-CM

## 2023-11-03 DIAGNOSIS — S81831A Puncture wound without foreign body, right lower leg, initial encounter: Principal | ICD-10-CM

## 2023-11-03 LAB — CBC
HCT: 41.4 % (ref 36.0–46.0)
HCT: 32.5 % — ABNORMAL LOW (ref 36.0–46.0)
Hemoglobin: 13.6 g/dL (ref 12.0–15.0)
Hemoglobin: 11.2 g/dL — ABNORMAL LOW (ref 12.0–15.0)
MCH: 28.1 pg (ref 26.0–34.0)
MCH: 28.3 pg (ref 26.0–34.0)
MCHC: 32.9 g/dL (ref 30.0–36.0)
MCHC: 34.5 g/dL (ref 30.0–36.0)
MCV: 85.5 fL (ref 80.0–100.0)
MCV: 82.1 fL (ref 80.0–100.0)
Platelets: 326 10*3/uL (ref 150–400)
Platelets: 235 10*3/uL (ref 150–400)
RBC: 4.84 MIL/uL (ref 3.87–5.11)
RBC: 3.96 MIL/uL (ref 3.87–5.11)
RDW: 13.7 % (ref 11.5–15.5)
RDW: 13.7 % (ref 11.5–15.5)
WBC: 9.6 10*3/uL (ref 4.0–10.5)
WBC: 11.1 10*3/uL — ABNORMAL HIGH (ref 4.0–10.5)
nRBC: 0 % (ref 0.0–0.2)
nRBC: 0 % (ref 0.0–0.2)

## 2023-11-03 LAB — I-STAT CG4 LACTIC ACID, ED: Lactic Acid, Venous: 11.2 mmol/L (ref 0.5–1.9)

## 2023-11-03 LAB — COMPREHENSIVE METABOLIC PANEL WITH GFR
ALT: 39 U/L (ref 0–44)
AST: 41 U/L (ref 15–41)
Albumin: 4.4 g/dL (ref 3.5–5.0)
Alkaline Phosphatase: 60 U/L (ref 38–126)
Anion gap: 19 — ABNORMAL HIGH (ref 5–15)
BUN: 10 mg/dL (ref 6–20)
CO2: 15 mmol/L — ABNORMAL LOW (ref 22–32)
Calcium: 9.5 mg/dL (ref 8.9–10.3)
Chloride: 107 mmol/L (ref 98–111)
Creatinine, Ser: 1.14 mg/dL — ABNORMAL HIGH (ref 0.44–1.00)
GFR, Estimated: >60 mL/min (ref >60)
Glucose, Bld: 135 mg/dL — ABNORMAL HIGH (ref 70–99)
Potassium: 3 mmol/L — ABNORMAL LOW (ref 3.5–5.1)
Sodium: 141 mmol/L (ref 135–145)
Total Bilirubin: 0.6 mg/dL (ref 0.0–1.2)
Total Protein: 7.8 g/dL (ref 6.5–8.1)

## 2023-11-03 LAB — I-STAT CHEM 8, ED
BUN: 9 mg/dL (ref 6–20)
Calcium, Ion: 1.12 mmol/L — ABNORMAL LOW (ref 1.15–1.40)
Chloride: 108 mmol/L (ref 98–111)
Creatinine, Ser: 1.3 mg/dL — ABNORMAL HIGH (ref 0.44–1.00)
Glucose, Bld: 129 mg/dL — ABNORMAL HIGH (ref 70–99)
HCT: 44 % (ref 36.0–46.0)
Hemoglobin: 15 g/dL (ref 12.0–15.0)
Potassium: 2.9 mmol/L — ABNORMAL LOW (ref 3.5–5.1)
Sodium: 143 mmol/L (ref 135–145)
TCO2: 16 mmol/L — ABNORMAL LOW (ref 22–32)

## 2023-11-03 LAB — URINALYSIS, ROUTINE W REFLEX MICROSCOPIC
Bilirubin Urine: NEGATIVE
Glucose, UA: NEGATIVE mg/dL
Hgb urine dipstick: NEGATIVE
Ketones, ur: NEGATIVE mg/dL
Leukocytes,Ua: NEGATIVE
Nitrite: NEGATIVE
Protein, ur: NEGATIVE mg/dL
Specific Gravity, Urine: 1.017 (ref 1.005–1.030)
pH: 5 (ref 5.0–8.0)

## 2023-11-03 LAB — SURGICAL PCR SCREEN
MRSA, PCR: NEGATIVE
Staphylococcus aureus: NEGATIVE

## 2023-11-03 LAB — HCG, SERUM, QUALITATIVE: Preg, Serum: NEGATIVE

## 2023-11-03 LAB — CREATININE, SERUM
Creatinine, Ser: 1 mg/dL (ref 0.44–1.00)
GFR, Estimated: >60 mL/min (ref >60)

## 2023-11-03 LAB — PROTIME-INR
INR: 1 (ref 0.8–1.2)
Prothrombin Time: 12.9 s (ref 11.4–15.2)

## 2023-11-03 LAB — SAMPLE TO BLOOD BANK

## 2023-11-03 LAB — ETHANOL: Alcohol, Ethyl (B): 190 mg/dL — ABNORMAL HIGH (ref <15)

## 2023-11-03 SURGERY — INSERTION, INTRAMEDULLARY ROD, TIBIA
Anesthesia: General | Laterality: Right

## 2023-11-03 MED ORDER — FENTANYL CITRATE (PF) 100 MCG/2ML IJ SOLN
INTRAMUSCULAR | Status: AC
  Administered 2023-11-03: 50 ug via INTRAVENOUS
  Filled 2023-11-03: qty 2

## 2023-11-03 MED ORDER — OXYCODONE HCL 5 MG/5ML PO SOLN: 5.0000 mg | Freq: Once | ORAL | Status: DC | PRN

## 2023-11-03 MED ORDER — OXYCODONE HCL 5 MG PO TABS: 5.0000 mg | ORAL_TABLET | Freq: Once | ORAL | Status: DC | PRN

## 2023-11-03 MED ORDER — MORPHINE SULFATE (PF) 4 MG/ML IV SOLN
4.0000 mg | INTRAVENOUS | Status: DC | PRN
  Administered 2023-11-03 – 2023-11-04 (×4): 4 mg via INTRAVENOUS
  Filled 2023-11-03 (×5): qty 1

## 2023-11-03 MED ORDER — CEFAZOLIN SODIUM-DEXTROSE 2-4 GM/100ML-% IV SOLN
2.0000 g | Freq: Once | INTRAVENOUS | Status: AC
  Administered 2023-11-03: 2 g via INTRAVENOUS

## 2023-11-03 MED ORDER — SUGAMMADEX SODIUM 200 MG/2ML IV SOLN
INTRAVENOUS | Status: DC | PRN
  Administered 2023-11-03: 200 mg via INTRAVENOUS

## 2023-11-03 MED ORDER — LIDOCAINE 2% (20 MG/ML) 5 ML SYRINGE
INTRAMUSCULAR | Status: DC | PRN
  Administered 2023-11-03: 100 mg via INTRAVENOUS

## 2023-11-03 MED ORDER — CEFAZOLIN SODIUM-DEXTROSE 2-4 GM/100ML-% IV SOLN
INTRAVENOUS | Status: AC
  Filled 2023-11-03: qty 100

## 2023-11-03 MED ORDER — FENTANYL CITRATE (PF) 100 MCG/2ML IJ SOLN: 25.0000 ug | INTRAMUSCULAR | Status: DC | PRN

## 2023-11-03 MED ORDER — OXYCODONE HCL 5 MG PO TABS: 5.0000 mg | ORAL_TABLET | ORAL | 0 refills | Status: DC | PRN

## 2023-11-03 MED ORDER — ENOXAPARIN SODIUM 30 MG/0.3ML IJ SOSY
30.0000 mg | PREFILLED_SYRINGE | INTRAMUSCULAR | Status: DC
  Administered 2023-11-04 – 2023-11-06 (×3): 30 mg via SUBCUTANEOUS
  Filled 2023-11-03 (×3): qty 0.3

## 2023-11-03 MED ORDER — FENTANYL CITRATE (PF) 250 MCG/5ML IJ SOLN
INTRAMUSCULAR | Status: AC
Start: 2023-11-03 — End: ?
  Filled 2023-11-03: qty 5

## 2023-11-03 MED ORDER — BUPIVACAINE-EPINEPHRINE 0.25% -1:200000 IJ SOLN
INTRAMUSCULAR | Status: DC | PRN
  Administered 2023-11-03: 30 mL

## 2023-11-03 MED ORDER — CEFAZOLIN SODIUM-DEXTROSE 1-4 GM/50ML-% IV SOLN: 1.0000 g | Freq: Three times a day (TID) | INTRAVENOUS | Status: DC

## 2023-11-03 MED ORDER — TETANUS-DIPHTH-ACELL PERTUSSIS 5-2.5-18.5 LF-MCG/0.5 IM SUSY
0.5000 mL | PREFILLED_SYRINGE | Freq: Once | INTRAMUSCULAR | Status: AC
  Administered 2023-11-03: 0.5 mL via INTRAMUSCULAR

## 2023-11-03 MED ORDER — MIDAZOLAM HCL 2 MG/2ML IJ SOLN
INTRAMUSCULAR | Status: AC
  Filled 2023-11-03: qty 2

## 2023-11-03 MED ORDER — TRANEXAMIC ACID-NACL 1000-0.7 MG/100ML-% IV SOLN
1000.0000 mg | INTRAVENOUS | Status: AC
  Administered 2023-11-03: 1000 mg via INTRAVENOUS

## 2023-11-03 MED ORDER — TRANEXAMIC ACID-NACL 1000-0.7 MG/100ML-% IV SOLN
INTRAVENOUS | Status: AC
  Filled 2023-11-03: qty 100

## 2023-11-03 MED ORDER — DEXMEDETOMIDINE HCL IN NACL 80 MCG/20ML IV SOLN
INTRAVENOUS | Status: DC | PRN
  Administered 2023-11-03 (×3): 4 ug via INTRAVENOUS
  Administered 2023-11-03: 8 ug via INTRAVENOUS

## 2023-11-03 MED ORDER — CHLORHEXIDINE GLUCONATE 4 % EX SOLN: 60.0000 mL | Freq: Once | CUTANEOUS | Status: DC

## 2023-11-03 MED ORDER — ACETAMINOPHEN 10 MG/ML IV SOLN
INTRAVENOUS | Status: DC | PRN
  Administered 2023-11-03: 1000 mg via INTRAVENOUS

## 2023-11-03 MED ORDER — CHLORHEXIDINE GLUCONATE 0.12 % MT SOLN
15.0000 mL | Freq: Once | OROMUCOSAL | Status: AC
  Administered 2023-11-03: 15 mL via OROMUCOSAL

## 2023-11-03 MED ORDER — DEXMEDETOMIDINE HCL IN NACL 80 MCG/20ML IV SOLN
INTRAVENOUS | Status: AC
  Filled 2023-11-03: qty 20

## 2023-11-03 MED ORDER — MIDAZOLAM HCL 2 MG/2ML IJ SOLN
INTRAMUSCULAR | Status: DC | PRN
Start: 2023-11-03 — End: 2023-11-03
  Administered 2023-11-03: 2 mg via INTRAVENOUS

## 2023-11-03 MED ORDER — CEFAZOLIN SODIUM-DEXTROSE 2-4 GM/100ML-% IV SOLN
2.0000 g | Freq: Four times a day (QID) | INTRAVENOUS | Status: AC
  Administered 2023-11-03 – 2023-11-04 (×2): 2 g via INTRAVENOUS
  Filled 2023-11-03 (×2): qty 100

## 2023-11-03 MED ORDER — DROPERIDOL 2.5 MG/ML IJ SOLN: 0.6250 mg | Freq: Once | INTRAMUSCULAR | Status: DC | PRN

## 2023-11-03 MED ORDER — POVIDONE-IODINE 10 % EX SWAB
2.0000 | Freq: Once | CUTANEOUS | Status: AC
  Administered 2023-11-03: 2 via TOPICAL

## 2023-11-03 MED ORDER — ORAL CARE MOUTH RINSE: 15.0000 mL | Freq: Once | OROMUCOSAL | Status: AC

## 2023-11-03 MED ORDER — LIDOCAINE 2% (20 MG/ML) 5 ML SYRINGE
INTRAMUSCULAR | Status: AC
  Filled 2023-11-03: qty 5

## 2023-11-03 MED ORDER — DEXAMETHASONE SODIUM PHOSPHATE 10 MG/ML IJ SOLN
INTRAMUSCULAR | Status: DC | PRN
  Administered 2023-11-03: 5 mg via INTRAVENOUS

## 2023-11-03 MED ORDER — LACTATED RINGERS IV SOLN: INTRAVENOUS | Status: DC

## 2023-11-03 MED ORDER — ACETAMINOPHEN 10 MG/ML IV SOLN: 1000.0000 mg | Freq: Once | INTRAVENOUS | Status: DC | PRN

## 2023-11-03 MED ORDER — ONDANSETRON HCL 4 MG/2ML IJ SOLN: 4.0000 mg | Freq: Three times a day (TID) | INTRAMUSCULAR | Status: DC | PRN

## 2023-11-03 MED ORDER — LACTATED RINGERS IV SOLN: INTRAVENOUS | Status: AC

## 2023-11-03 MED ORDER — ROCURONIUM BROMIDE 10 MG/ML (PF) SYRINGE
PREFILLED_SYRINGE | INTRAVENOUS | Status: DC | PRN
Start: 2023-11-03 — End: 2023-11-03
  Administered 2023-11-03: 10 mg via INTRAVENOUS
  Administered 2023-11-03: 60 mg via INTRAVENOUS

## 2023-11-03 MED ORDER — CEFAZOLIN SODIUM-DEXTROSE 2-4 GM/100ML-% IV SOLN
2.0000 g | INTRAVENOUS | Status: AC
  Administered 2023-11-03: 2 g via INTRAVENOUS

## 2023-11-03 MED ORDER — SODIUM CHLORIDE 0.9 % IV SOLN
INTRAVENOUS | Status: AC | PRN
Start: 2023-11-03 — End: 2023-11-03
  Administered 2023-11-03: 1000 mL via INTRAVENOUS

## 2023-11-03 MED ORDER — ROCURONIUM BROMIDE 10 MG/ML (PF) SYRINGE
PREFILLED_SYRINGE | INTRAVENOUS | Status: AC
  Filled 2023-11-03: qty 10

## 2023-11-03 MED ORDER — KETOROLAC TROMETHAMINE 30 MG/ML IJ SOLN
INTRAMUSCULAR | Status: DC | PRN
Start: 2023-11-03 — End: 2023-11-03
  Administered 2023-11-03: 30 mg via INTRAVENOUS

## 2023-11-03 MED ORDER — DOCUSATE SODIUM 100 MG PO CAPS
100.0000 mg | ORAL_CAPSULE | Freq: Two times a day (BID) | ORAL | Status: DC
  Administered 2023-11-03 – 2023-11-06 (×6): 100 mg via ORAL
  Filled 2023-11-03 (×6): qty 1

## 2023-11-03 MED ORDER — FENTANYL CITRATE (PF) 250 MCG/5ML IJ SOLN
INTRAMUSCULAR | Status: DC | PRN
  Administered 2023-11-03: 50 ug via INTRAVENOUS
  Administered 2023-11-03: 100 ug via INTRAVENOUS
  Administered 2023-11-03 (×2): 50 ug via INTRAVENOUS

## 2023-11-03 MED ORDER — ACETAMINOPHEN 10 MG/ML IV SOLN
INTRAVENOUS | Status: AC
Start: 2023-11-03 — End: ?
  Filled 2023-11-03: qty 100

## 2023-11-03 MED ORDER — ONDANSETRON 4 MG PO TBDP: 4.0000 mg | ORAL_TABLET | Freq: Three times a day (TID) | ORAL | 0 refills | Status: DC | PRN

## 2023-11-03 MED ORDER — ONDANSETRON HCL 4 MG/2ML IJ SOLN
INTRAMUSCULAR | Status: DC | PRN
  Administered 2023-11-03: 4 mg via INTRAVENOUS

## 2023-11-03 MED ORDER — PROPOFOL 10 MG/ML IV BOLUS
INTRAVENOUS | Status: DC | PRN
  Administered 2023-11-03: 200 mg via INTRAVENOUS

## 2023-11-03 MED ORDER — BUPIVACAINE-EPINEPHRINE (PF) 0.25% -1:200000 IJ SOLN
INTRAMUSCULAR | Status: AC
  Filled 2023-11-03: qty 30

## 2023-11-03 MED ORDER — PROPOFOL 10 MG/ML IV BOLUS
INTRAVENOUS | Status: AC
  Filled 2023-11-03: qty 20

## 2023-11-03 MED ORDER — 0.9 % SODIUM CHLORIDE (POUR BTL) OPTIME
TOPICAL | Status: DC | PRN
  Administered 2023-11-03: 2000 mL

## 2023-11-03 MED ORDER — TRANEXAMIC ACID-NACL 1000-0.7 MG/100ML-% IV SOLN
1000.0000 mg | Freq: Once | INTRAVENOUS | Status: AC
  Administered 2023-11-03: 1000 mg via INTRAVENOUS
  Filled 2023-11-03: qty 100

## 2023-11-03 SURGICAL SUPPLY — 51 items
ALCOHOL 70% 16 OZ (MISCELLANEOUS) ×1 IMPLANT
BAG COUNTER SPONGE SURGICOUNT (BAG) ×1 IMPLANT
BANDAGE ESMARK 6X9 LF (GAUZE/BANDAGES/DRESSINGS) ×1 IMPLANT
BIT DRILL 4.0X165 AO STYLE (BIT) IMPLANT
BIT DRILL 4.0X280 (BIT) IMPLANT
BNDG ELASTIC 4INX 5YD STR LF (GAUZE/BANDAGES/DRESSINGS) IMPLANT
BNDG ELASTIC 4X5.8 VLCR STR LF (GAUZE/BANDAGES/DRESSINGS) ×1 IMPLANT
BNDG ELASTIC 6INX 5YD STR LF (GAUZE/BANDAGES/DRESSINGS) ×1 IMPLANT
COVER MAYO STAND STRL (DRAPES) ×1 IMPLANT
COVER SURGICAL LIGHT HANDLE (MISCELLANEOUS) ×1 IMPLANT
CUFF TRNQT CYL 34X4.125X (TOURNIQUET CUFF) ×1 IMPLANT
DRAPE C-ARM 42X72 X-RAY (DRAPES) ×1 IMPLANT
DRAPE C-ARMOR (DRAPES) ×1 IMPLANT
DRAPE HALF SHEET 40X57 (DRAPES) ×1 IMPLANT
DRAPE IMP U-DRAPE 54X76 (DRAPES) ×1 IMPLANT
DRAPE POUCH INSTRU U-SHP 10X18 (DRAPES) ×1 IMPLANT
DRAPE U-SHAPE 47X51 STRL (DRAPES) ×1 IMPLANT
DRAPE UTILITY XL STRL (DRAPES) ×2 IMPLANT
DURAPREP 26ML APPLICATOR (WOUND CARE) ×1 IMPLANT
ELECT CAUTERY BLADE 6.4 (BLADE) ×1 IMPLANT
ELECTRODE REM PT RTRN 9FT ADLT (ELECTROSURGICAL) ×1 IMPLANT
FACESHIELD WRAPAROUND (MASK) ×2 IMPLANT
FACESHIELD WRAPAROUND OR TEAM (MASK) ×2 IMPLANT
GAUZE PAD ABD 8X10 STRL (GAUZE/BANDAGES/DRESSINGS) IMPLANT
GAUZE SPONGE 4X4 12PLY STRL (GAUZE/BANDAGES/DRESSINGS) ×1 IMPLANT
GAUZE XEROFORM 1X8 LF (GAUZE/BANDAGES/DRESSINGS) ×2 IMPLANT
GAUZE XEROFORM 5X9 LF (GAUZE/BANDAGES/DRESSINGS) IMPLANT
GLOVE BIO SURGEON STRL SZ7.5 (GLOVE) ×2 IMPLANT
GLOVE BIOGEL PI IND STRL 8 (GLOVE) ×2 IMPLANT
GOWN STRL REUS W/ TWL LRG LVL3 (GOWN DISPOSABLE) ×1 IMPLANT
GOWN STRL REUS W/ TWL XL LVL3 (GOWN DISPOSABLE) ×2 IMPLANT
GUIDEWIRE ORTH 900X3XBALL NOSE (WIRE) IMPLANT
KIT BASIN OR (CUSTOM PROCEDURE TRAY) ×1 IMPLANT
MANIFOLD NEPTUNE II (INSTRUMENTS) ×1 IMPLANT
NAIL TIBIAL 9X39 (Nail) IMPLANT
NS IRRIG 1000ML POUR BTL (IV SOLUTION) ×1 IMPLANT
PACK TOTAL JOINT (CUSTOM PROCEDURE TRAY) ×1 IMPLANT
PAD CAST 4YDX4 CTTN HI CHSV (CAST SUPPLIES) ×2 IMPLANT
PADDING CAST COTTON 6X4 STRL (CAST SUPPLIES) IMPLANT
PIN GUIDE THRD AR 3.2X330 (PIN) IMPLANT
SCREW CORT CAPT FT 5X70 (Screw) IMPLANT
SCREW CORT CAPTR 5X44 (Screw) IMPLANT
SCREW CORT CAPTR 5X46 (Screw) IMPLANT
SCREW CORT CAPTR 5X50 (Screw) IMPLANT
SCREW CORT CAPTR 5X55 (Screw) IMPLANT
SHEATH TIB ENTRY SP ART DISP (SHEATH) IMPLANT
STAPLER SKIN PROX WIDE 3.9 (STAPLE) ×1 IMPLANT
SUT MON AB 2-0 CT1 36 (SUTURE) ×1 IMPLANT
SUT VIC AB 0 CT1 27XBRD ANBCTR (SUTURE) ×1 IMPLANT
TOWEL GREEN STERILE (TOWEL DISPOSABLE) ×2 IMPLANT
WATER STERILE IRR 1000ML POUR (IV SOLUTION) ×1 IMPLANT
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
## 2023-11-03 NOTE — Progress Notes (Signed)
 Orthopedic Tech Progress Note Patient Details:  Cathy Weber [birth year] [id number]  Ortho Devices Type of Ortho Device: Post (short leg) splint, Stirrup splint Ortho Device/Splint Location: rle Ortho Device/Splint Interventions: Ordered, Application, Adjustment  I applied splint with the drs assist post reduction. Post Interventions Patient Tolerated: Well Instructions Provided: Care of device, Adjustment of device  [name] 11/03/2023, 4:17 AM
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
## 2023-11-03 NOTE — Anesthesia Postprocedure Evaluation (Signed)
 Anesthesia Post Note  Patient: Arts administrator  Procedure(s) Performed: INSERTION, INTRAMEDULLARY ROD, TIBIA (Right)     Patient location during evaluation: PACU Anesthesia Type: General Level of consciousness: awake and alert Pain management: pain level controlled Vital Signs Assessment: post-procedure vital signs reviewed and stable Respiratory status: spontaneous breathing, nonlabored ventilation, respiratory function stable and patient connected to nasal cannula oxygen Cardiovascular status: blood pressure returned to baseline and stable Postop Assessment: no apparent nausea or vomiting Anesthetic complications: no   No notable events documented.  Last Vitals:  Vitals:   11/03/23 1630 11/03/23 1645  BP: 122/83 113/67  Pulse: 78 78  Resp: (!) 22 16  Temp:    SpO2: 99% 97%    Last Pain:  Vitals:   11/03/23 1645  TempSrc:   PainSc: 5                  Lethaniel Rave

## 2023-11-03 NOTE — Transfer of Care (Signed)
 Immediate Anesthesia Transfer of Care Note  Patient: Cathy Weber  Procedure(s) Performed: INSERTION, INTRAMEDULLARY ROD, TIBIA (Right)  Patient Location: PACU  Anesthesia Type:General  Level of Consciousness: sedated and responds to stimulation  Airway & Oxygen Therapy: Patient Spontanous Breathing and Patient connected to face mask oxygen  Post-op Assessment: Report given to RN and Post -op Vital signs reviewed and stable  Post vital signs: Reviewed and stable  Last Vitals:  Vitals Value Taken Time  BP 119/77 11/03/23 1600  Temp    Pulse 90 11/03/23 1601  Resp 26 11/03/23 1601  SpO2 99 % 11/03/23 1601  Vitals shown include unfiled device data.  Last Pain:  Vitals:   11/03/23 1311  TempSrc:   PainSc: 5          Complications: No notable events documented.
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
## 2023-11-03 NOTE — ED Notes (Signed)
 Wound dressed by Tiffany RN with xeroform, nonadherent pad, gauze, and kerlix, per Dr. Marvia Slocumb orders. Posterior and stirrup splint applied by Manny (ortho tech) and Dr. Candelaria Chaco.

## 2023-11-03 NOTE — Anesthesia Procedure Notes (Signed)
 Procedure Name: Intubation Date/Time: 11/03/2023 1:48 PM  Performed by: Fayette Hamada J, CRNAPre-anesthesia Checklist: Patient identified, Emergency Drugs available, Suction available and Patient being monitored Patient Re-evaluated:Patient Re-evaluated prior to induction Oxygen Delivery Method: Circle System Utilized Preoxygenation: Pre-oxygenation with 100% oxygen Induction Type: IV induction Ventilation: Mask ventilation without difficulty Laryngoscope Size: Miller and 3 Grade View: Grade I Tube type: Oral Tube size: 7.0 mm Number of attempts: 1 Airway Equipment and Method: Stylet and Oral airway Placement Confirmation: ETT inserted through vocal cords under direct vision, positive ETCO2 and breath sounds checked- equal and bilateral Secured at: 23 cm Tube secured with: Tape Dental Injury: Teeth and Oropharynx as per pre-operative assessment
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
## 2023-11-04 ENCOUNTER — Other Ambulatory Visit: Payer: Self-pay

## 2023-11-04 MED ORDER — ASPIRIN 81 MG PO TBEC
81.0000 mg | DELAYED_RELEASE_TABLET | Freq: Two times a day (BID) | ORAL | Status: AC
Start: 2023-11-04 — End: 2023-12-19

## 2023-11-04 MED ORDER — OXYCODONE HCL 5 MG PO TABS
5.0000 mg | ORAL_TABLET | ORAL | Status: DC | PRN
  Administered 2023-11-04 – 2023-11-06 (×6): 5 mg via ORAL
  Filled 2023-11-04 (×6): qty 1

## 2023-11-04 MED ORDER — METHOCARBAMOL 500 MG PO TABS
500.0000 mg | ORAL_TABLET | Freq: Four times a day (QID) | ORAL | Status: DC
  Administered 2023-11-04 – 2023-11-06 (×9): 500 mg via ORAL
  Filled 2023-11-04 (×9): qty 1

## 2023-11-04 MED ORDER — ACETAMINOPHEN 325 MG PO TABS
650.0000 mg | ORAL_TABLET | Freq: Four times a day (QID) | ORAL | Status: DC | PRN
  Administered 2023-11-04 – 2023-11-05 (×3): 650 mg via ORAL
  Filled 2023-11-04 (×3): qty 2
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
## 2023-11-04 NOTE — Plan of Care (Signed)
  Problem: Clinical Measurements: Goal: Ability to maintain clinical measurements within normal limits will improve Outcome: Progressing   Problem: Coping: Goal: Level of anxiety will decrease Outcome: Progressing   Problem: Elimination: Goal: Will not experience complications related to bowel motility Outcome: Progressing
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
## 2023-11-04 NOTE — Progress Notes (Signed)
 Transition of Care [facility]) - Inpatient Brief Assessment   Patient Details  Name: Cathy Weber MRN: [id number] Date of Birth: 2000/09/03  [facility] CM/SW Contact:    Jannine Meo, RN Phone Number: 11/04/2023, 1:54 PM   Clinical Narrative: Patient from home. No PT follow up recommended at this time. Order for crutches noted.   Transition of Care Asessment: Insurance and Status: (P) Insurance coverage has been reviewed Patient has primary care physician: (P) No (None listed) Home environment has been reviewed: (P) Home Prior level of function:: (P) Independent Prior/Current Home Services: (P) No current home services Social Drivers of Health Review: (P) SDOH reviewed no interventions necessary Readmission risk has been reviewed: (P) Yes Transition of care needs: (P) no transition of care needs at this time
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
## 2023-11-04 NOTE — Progress Notes (Signed)
 Orthopedic Tech Progress Note Patient Details:  Cathy Weber [birth year] [id number]  Ortho Devices Type of Ortho Device: Crutches Ortho Device/Splint Location: RLE Ortho Device/Splint Interventions: Adjustment, Ordered   Post Interventions Patient Tolerated: Fair Instructions Provided: Care of device  Ivor Kishi A Jodiann Ognibene 11/04/2023, 1:58 PM

## 2023-11-04 NOTE — Plan of Care (Signed)
  Problem: Pain Managment: Goal: General experience of comfort will improve and/or be controlled Outcome: Progressing   Problem: Safety: Goal: Ability to remain free from injury will improve Outcome: Progressing   Problem: Skin Integrity: Goal: Risk for impaired skin integrity will decrease Outcome: Progressing

## 2023-11-05 ENCOUNTER — Encounter (HOSPITAL_COMMUNITY): Payer: Self-pay | Admitting: Orthopedic Surgery

## 2023-11-05 MED ORDER — ACETAMINOPHEN 325 MG PO TABS
650.0000 mg | ORAL_TABLET | Freq: Four times a day (QID) | ORAL | Status: DC
  Administered 2023-11-05 – 2023-11-06 (×5): 650 mg via ORAL
  Filled 2023-11-05 (×5): qty 2

## 2023-11-05 NOTE — Progress Notes (Signed)
 Patient had difficulty with PT today, will stay another night and work on Geologist, engineering .

## 2023-11-05 NOTE — Discharge Instructions (Signed)
 Postoperative instructions:  Weightbearing: You can put up to 50% of your weight while wearing the cam boot as tolerated.  Use crutches for assistance.  You can move the knee and ankle as much as you can tolerate.  Dressings: Keep wounds covered with dry dressings and change as needed for any drainage or if bandages get wet.  You can shower starting postoperative day 4.  No submerging of the incisions.  Pat wounds dry and redress them afterwards.  Blood clot prevention: Take aspirin 81 mg twice a day for blood clot prevention.  This can be obtained over-the-counter.  Pain control: Take Tylenol  and ibuprofen  as directed over-the-counter around-the-clock for pain.  Oxycodone  is at your pharmacy to be used as needed for breakthrough pain.  Follow-up: Follow-up in the office in 2 weeks for wound check and staple removal.  Call the office at [phone] to make your appointment with [facility], PA-C.  If you have any questions, concerns or need any refills call the office for those as well.
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
## 2023-11-05 NOTE — Progress Notes (Signed)
   Subjective:  Cathy Weber is a 23 y.o. female, 2 Days Post-Op    s/p Procedure(s): INSERTION, INTRAMEDULLARY ROD, TIBIA   Patient reports pain as mild to moderate.  Denies n/t, fever or chills.  Objective:   VITALS:   Vitals:   11/04/23 0840 11/04/23 1606 11/04/23 1935 11/05/23 0736  BP: 102/61 121/72 105/66 112/71  Pulse: 75 73 81 86  Resp: 18 17 16    Temp: 98.3 F (36.8 C) 97.6 F (36.4 C) 98.6 F (37 C) 98.1 F (36.7 C)  TempSrc: Oral Oral  Oral  SpO2: 99% 100% 98% 99%  Weight:      Height:       NAD  RLE:    Neurovascular intact Sensation intact distally Intact pulses distally Dorsiflexion/Plantar flexion intact Incision: dressing C/D/I No cellulitis present Compartment soft   Lab Results  Component Value Date   WBC 11.1 (H) 11/03/2023   HGB 11.2 (L) 11/03/2023   HCT 32.5 (L) 11/03/2023   MCV 82.1 11/03/2023   PLT 235 11/03/2023   BMET    Component Value Date/Time   NA 141 11/03/2023 0301   K 3.0 (L) 11/03/2023 0301   CL 107 11/03/2023 0301   CO2 15 (L) 11/03/2023 0301   GLUCOSE 135 (H) 11/03/2023 0301   BUN 10 11/03/2023 0301   CREATININE 1.00 11/03/2023 2107   CALCIUM 9.5 11/03/2023 0301   GFRNONAA >60 11/03/2023 2107     Assessment/Plan: 2 Days Post-Op   Principal Problem:   Traumatic type I or II open fracture of shaft of right tibia and fibula   Advance diet Up with therapy  Dispo: D/c once clears therapy/ stair training.\, follow up in the office in 2 weeks for staple removal.   Cam boot to be worn when WB and intermittently to prevent ankle flexion contracture, can be worn at night as well for comfort and positioning  Weightbearing Status: 50% weightbearing with CAM boot on and crutches.  DVT Prophylaxis: lovenox inpatient, aspirin 81mg  BID outpatient  Will plan for OPPT at follow up  Dressings changed today with mepilexes, will likely need to change the distal dressings again at some point due to bleeding. Ok to  change as needed.   Catori Panozzo D Panagiotis Oelkers 11/05/2023, 12:40 PM  Karyl Paget PA-C  Physician Assistant with Dr. Link [facility] [place]

## 2023-11-05 NOTE — Plan of Care (Signed)
  Problem: Education: Goal: Knowledge of General Education information will improve Description: Including pain rating scale, medication(s)/side effects and non-pharmacologic comfort measures Outcome: Progressing   Problem: Health Behavior/Discharge Planning: Goal: Ability to manage health-related needs will improve Outcome: Progressing   Problem: Clinical Measurements: Goal: Ability to maintain clinical measurements within normal limits will improve Outcome: Progressing   Problem: Nutrition: Goal: Adequate nutrition will be maintained Outcome: Progressing   Problem: Pain Managment: Goal: General experience of comfort will improve and/or be controlled Outcome: Progressing

## 2023-11-05 NOTE — Progress Notes (Signed)
 Transition of Care [facility]) - CAGE-AID Screening   Patient Details  Name: Cathy Weber MRN: [id number] Date of Birth: February 23, 2001   Asa Bjork, RN Trauma Response Nurse Phone Number: [phone] 11/05/2023, 5:44 PM    CAGE-AID Screening:    Have You Ever Felt You Ought to Cut Down on Your Drinking or Drug Use?: No Have People Annoyed You By Critizing Your Drinking Or Drug Use?: No Have You Felt Bad Or Guilty About Your Drinking Or Drug Use?: No Have You Ever Had a Drink or Used Drugs First Thing In The Morning to Steady Your Nerves or to Get Rid of a Hangover?: No CAGE-AID Score: 0  Substance Abuse Education Offered: (S) No (No resources needed, denies alcohol/drug misuse)

## 2023-11-05 NOTE — Plan of Care (Signed)

## 2023-11-06 NOTE — Progress Notes (Signed)
    Durable Medical Equipment  (From admission, onward)           Start     Ordered   11/06/23 1109  For home use only DME Bedside commode  Once       Comments: Confine to one room  Question:  Patient needs a bedside commode to treat with the following condition  Answer:  Gait instability   11/06/23 1109   11/04/23 1222  For home use only DME Crutches  Once        11/04/23 1221

## 2023-11-06 NOTE — Progress Notes (Signed)
    Durable Medical Equipment  (From admission, onward)           Start     Ordered   11/06/23 1107  For home use only DME Bedside commode  Once       Question:  Patient needs a bedside commode to treat with the following condition  Answer:  Gait instability   11/06/23 1107   11/04/23 1222  For home use only DME Crutches  Once        11/04/23 1221

## 2023-11-06 NOTE — TOC Progression Note (Signed)
 Transition of Care [facility]) - Progression Note    Patient Details  Name: Cathy Weber MRN: [id number] Date of Birth: 2000-08-14  [facility] of [facility] CM/SW Contact  Alisa App, RN Phone Number: 11/06/2023, 11:17 AM  Clinical Narrative:    Therapy recommending DME: [facility].Pt agreeable, no provider preference.Order placed for MD co signature, NCM made MD aware vis secure chat. Referral made with Zach / [facility] [phone]) for DME : [facility]. Equipment will bed delivered to bedside prior to d/c. TOC team will continue to monitor and assist with needs....    Expected Discharge Plan and Services         Expected Discharge Date: 11/05/23               DME Arranged: Bedside commode           HH Agency: [facility] Date HH Agency Contacted: 11/06/23 Time HH Agency Contacted: 1117 Representative spoke with at [facility] Agency: Gladys Lamp   Social Determinants of Health (SDOH) Interventions SDOH Screenings   Food Insecurity: No Food Insecurity (11/03/2023)  Housing: Low Risk  (11/03/2023)  Transportation Needs: No Transportation Needs (11/03/2023)  Utilities: Not At Risk (11/03/2023)  Social Connections: Unknown (11/03/2023)  Tobacco Use: Medium Risk (11/03/2023)    Readmission Risk Interventions     No data to display

## 2023-11-06 NOTE — Discharge Planning (Signed)
 Patient alert. Discharge teaching given to patient. Patient verbalized understanding of teaching including medications and care of the incision site. Discharge summary placed in discharge packet and placed in patient belongings bag with dressing supplies. Patient will be transported home by her mother.
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
## 2023-11-14 ENCOUNTER — Encounter (HOSPITAL_COMMUNITY): Payer: Self-pay | Admitting: Orthopedic Surgery

## 2023-11-22 ENCOUNTER — Ambulatory Visit: Attending: Orthopedic Surgery

## 2023-11-22 ENCOUNTER — Other Ambulatory Visit: Payer: Self-pay

## 2023-11-22 DIAGNOSIS — R2689 Other abnormalities of gait and mobility: Secondary | ICD-10-CM

## 2023-11-22 DIAGNOSIS — M6281 Muscle weakness (generalized): Secondary | ICD-10-CM

## 2023-11-22 DIAGNOSIS — M25671 Stiffness of right ankle, not elsewhere classified: Secondary | ICD-10-CM | POA: Diagnosis present

## 2023-11-22 DIAGNOSIS — M25661 Stiffness of right knee, not elsewhere classified: Secondary | ICD-10-CM | POA: Diagnosis present
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
## 2023-11-27 ENCOUNTER — Ambulatory Visit: Attending: Orthopedic Surgery

## 2023-11-27 DIAGNOSIS — R2689 Other abnormalities of gait and mobility: Secondary | ICD-10-CM | POA: Diagnosis present

## 2023-11-27 DIAGNOSIS — M6281 Muscle weakness (generalized): Secondary | ICD-10-CM | POA: Diagnosis present

## 2023-11-27 DIAGNOSIS — M25671 Stiffness of right ankle, not elsewhere classified: Secondary | ICD-10-CM | POA: Insufficient documentation

## 2023-11-27 DIAGNOSIS — M25661 Stiffness of right knee, not elsewhere classified: Secondary | ICD-10-CM | POA: Diagnosis present
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
## 2023-11-29 ENCOUNTER — Ambulatory Visit

## 2023-11-29 DIAGNOSIS — R2689 Other abnormalities of gait and mobility: Secondary | ICD-10-CM | POA: Diagnosis not present

## 2023-11-29 DIAGNOSIS — M6281 Muscle weakness (generalized): Secondary | ICD-10-CM

## 2023-11-29 DIAGNOSIS — M25661 Stiffness of right knee, not elsewhere classified: Secondary | ICD-10-CM

## 2023-11-29 DIAGNOSIS — M25671 Stiffness of right ankle, not elsewhere classified: Secondary | ICD-10-CM
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
## 2023-12-02 ENCOUNTER — Ambulatory Visit

## 2023-12-02 ENCOUNTER — Telehealth: Payer: Self-pay

## 2023-12-02 NOTE — Telephone Encounter (Signed)
 PT called and left voicemail message regarding missed visit today. Also went over attendance policy and reminder of next appointment.  Ivor Mars, PT, DPT 12/02/23 11:54 AM
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
## 2023-12-05 ENCOUNTER — Ambulatory Visit
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
## 2023-12-10 ENCOUNTER — Telehealth: Payer: Self-pay

## 2023-12-10 ENCOUNTER — Ambulatory Visit
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
## 2023-12-10 NOTE — Telephone Encounter (Signed)
 PT called and spoke with patient regarding missed visit. We reviewed attendance policy and confirmed next appointment. We can only schedule one visit at a time, will try and schedule later appointments to help with her transportation.   Ivor Mars PT, DPT 12/10/23 4:56 PM

## 2023-12-12 ENCOUNTER — Telehealth: Payer: Self-pay

## 2023-12-12 ENCOUNTER — Ambulatory Visit

## 2023-12-12 NOTE — Telephone Encounter (Signed)
 Called patient d/t 3rd missed appointment. Unable to LVM. She will need new MD referral to return to our clinic, per provided attendance policy. - MJ

## 2023-12-17 ENCOUNTER — Ambulatory Visit

## 2024-01-06 ENCOUNTER — Ambulatory Visit (HOSPITAL_COMMUNITY)
Admission: EM | Admit: 2024-01-06 | Discharge: 2024-01-06 | Disposition: A | Discharge status: Home / Self Care | Attending: Emergency Medicine | Admitting: Emergency Medicine

## 2024-01-06 ENCOUNTER — Encounter (HOSPITAL_COMMUNITY): Payer: Self-pay | Admitting: Emergency Medicine

## 2024-01-06 DIAGNOSIS — Z113 Encounter for screening for infections with a predominantly sexual mode of transmission: Secondary | ICD-10-CM | POA: Diagnosis not present

## 2024-01-06 DIAGNOSIS — N898 Other specified noninflammatory disorders of vagina: Secondary | ICD-10-CM | POA: Insufficient documentation

## 2024-01-06 DIAGNOSIS — Z3202 Encounter for pregnancy test, result negative: Secondary | ICD-10-CM

## 2024-01-06 LAB — POCT URINALYSIS DIP (MANUAL ENTRY)
Glucose, UA: NEGATIVE mg/dL
Ketones, POC UA: NEGATIVE mg/dL
Nitrite, UA: NEGATIVE
Protein Ur, POC: 30 mg/dL — AB
Spec Grav, UA: >=1.03 — AB (ref 1.010–1.025)
Urobilinogen, UA: 0.2 U/dL
pH, UA: 5.5 (ref 5.0–8.0)

## 2024-01-06 LAB — POCT URINE PREGNANCY: Preg Test, Ur: NEGATIVE

## 2024-01-06 MED ORDER — METRONIDAZOLE 500 MG PO TABS: 500.0000 mg | ORAL_TABLET | Freq: Two times a day (BID) | ORAL | 0 refills | Status: DC

## 2024-01-06 NOTE — Discharge Instructions (Signed)
 Take the Flagyl  twice daily with food for the next 7 days unless directed to stop.  Do not drink alcohol on this medication.  We have screened you for some sexually transmitted infections and we will contact you if treatment modification is needed.  Abstain from intercourse until all results have been received.  Return to clinic for any new or urgent symptoms.

## 2024-01-06 NOTE — ED Triage Notes (Signed)
 Pt reports vaginal discharge for week that has little smell. Requesting STD testing.

## 2024-01-06 NOTE — ED Provider Notes (Signed)
 [facility]    CSN: [id number] Arrival date & time: 01/06/24  1316      History   Chief Complaint Chief Complaint  Patient presents with   Vaginal Itching    HPI Cathy Weber is a 23 y.o. female.   Patient presents to clinic over concern of a yellow vaginal discharge with an odor that has been present for the past week.  She has had a new sexual partner and had unprotected intercourse around 2 weeks ago.  Is concerned about sexually transmitted infections.  Reports symptoms and presentation are consistent with previous BV infections.   Has not had any abdominal pain, pelvic pain, dysuria, urgency, frequency, flank pain or fevers.  Declines HIV and syphilis screening.  The history is provided by the patient and medical records.  Vaginal Itching    Past Medical History:  Diagnosis Date   Alpha thalassemia silent carrier 11/19/2018   Medical history non-contributory    Sickle cell trait (HCC) 11/19/2018    Patient Active Problem List   Diagnosis Date Noted   Traumatic type I or II open fracture of shaft of right tibia and fibula 11/03/2023   Sickle cell trait (HCC) 11/19/2018   Alpha thalassemia silent carrier 11/19/2018    Past Surgical History:  Procedure Laterality Date   NO PAST SURGERIES     TIBIA IM NAIL INSERTION Right 11/03/2023   Procedure: INSERTION, INTRAMEDULLARY ROD, TIBIA;  Surgeon: Sharl Selinda Dover, MD;  Location: [facility] OR;  Service: Orthopedics;  Laterality: Right;    OB History     Gravida  3   Para  1   Term  1   Preterm      AB      Living  1      SAB      IAB      Ectopic      Multiple  0   Live Births  1            Home Medications    Prior to Admission medications   Medication Sig Start Date End Date Taking? Authorizing Provider  metroNIDAZOLE  (FLAGYL ) 500 MG tablet Take 1 tablet (500 mg total) by mouth 2 (two) times daily. 01/06/24  Yes Keidrick Murty  N, FNP  ondansetron  (ZOFRAN -ODT) 4 MG  disintegrating tablet Take 1 tablet (4 mg total) by mouth every 8 (eight) hours as needed for vomiting or nausea. Patient not taking: Reported on 11/22/2023 11/03/23   Sharl Selinda Dover, MD  oxyCODONE  (ROXICODONE ) 5 MG immediate release tablet Take 1 tablet (5 mg total) by mouth every 4 (four) hours as needed for severe pain (pain score 7-10) or moderate pain (pain score 4-6). Patient not taking: Reported on 11/22/2023 11/03/23 11/02/24  Sharl Selinda Dover, MD  Blood Pressure Monitoring (BLOOD PRESSURE CUFF) MISC 1 Device by Does not apply route once a week. For weekly monitoring of blood pressure Patient not taking: Reported on 05/04/2019 11/06/18 02/17/20  Herchel Gloris LABOR, MD    Family History Family History  Problem Relation Age of Onset   Healthy Mother    Healthy Father     Social History Social History   Tobacco Use   Smoking status: Former   Smokeless tobacco: Never  Advertising account planner   Vaping status: Never Used  Substance Use Topics   Alcohol use: Not Currently   Drug use: Not Currently    Types: Marijuana     Allergies   Patient has no known allergies.  Review of Systems Review of Systems  Per HPI  Physical Exam Triage Vital Signs ED Triage Vitals  Encounter Vitals Group     BP 01/06/24 1352 116/77     Girls Systolic BP Percentile --      Girls Diastolic BP Percentile --      Boys Systolic BP Percentile --      Boys Diastolic BP Percentile --      Pulse Rate 01/06/24 1352 64     Resp 01/06/24 1352 14     Temp 01/06/24 1352 97.7 F (36.5 C)     Temp Source 01/06/24 1352 Oral     SpO2 01/06/24 1352 98 %     Weight --      Height --      Head Circumference --      Peak Flow --      Pain Score 01/06/24 1350 0     Pain Loc --      Pain Education --      Exclude from Growth Chart --    No data found.  Updated Vital Signs BP 116/77 (BP Location: Left Arm)   Pulse 64   Temp 97.7 F (36.5 C) (Oral)   Resp 14   LMP 12/22/2023 (Exact Date)   SpO2 98%    Visual Acuity Right Eye Distance:   Left Eye Distance:   Bilateral Distance:    Right Eye Near:   Left Eye Near:    Bilateral Near:     Physical Exam Vitals and nursing note reviewed.  Constitutional:      Appearance: Normal appearance.  HENT:     Head: Normocephalic and atraumatic.     Right Ear: External ear normal.     Left Ear: External ear normal.     Nose: Nose normal.     Mouth/Throat:     Mouth: Mucous membranes are moist.  Eyes:     Conjunctiva/sclera: Conjunctivae normal.  Cardiovascular:     Rate and Rhythm: Normal rate.  Pulmonary:     Effort: Pulmonary effort is normal. No respiratory distress.  Skin:    General: Skin is warm and dry.  Neurological:     General: No focal deficit present.     Mental Status: She is alert and oriented to person, place, and time.  Psychiatric:        Mood and Affect: Mood normal.        Behavior: Behavior normal.      UC Treatments / Results  Labs (all labs ordered are listed, but only abnormal results are displayed) Labs Reviewed  POCT URINALYSIS DIP (MANUAL ENTRY) - Abnormal; Notable for the following components:      Result Value   Clarity, UA hazy (*)    Bilirubin, UA small (*)    Spec Grav, UA >=1.030 (*)    Blood, UA small (*)    Protein Ur, POC =30 (*)    Leukocytes, UA Trace (*)    All other components within normal limits  POCT URINE PREGNANCY  CERVICOVAGINAL ANCILLARY ONLY    EKG   Radiology No results found.  Procedures Procedures (including critical care time)  Medications Ordered in UC Medications - No data to display  Initial Impression / Assessment and Plan / UC Course  I have reviewed the triage vital signs and the nursing notes.  Pertinent labs & imaging results that were available during my care of the patient were reviewed by me and considered in my medical decision  making (see chart for details).  Vitals and triage reviewed, patient is hemodynamically stable.  Vaginal discharge  that is yellow with an odor, consistent with previous BV infections, will treat empirically with Flagyl .  Cytology swab obtained and staff will contact patient if additional treatment or modification is needed.  UA with some evidence of dehydration, patient just woke up before she came to clinic.  Denies urinary symptoms.  Urine pregnancy negative.  Plan of care, follow-up care and return precautions given this time.    Final Clinical Impressions(s) / UC Diagnoses   Final diagnoses:  Vaginal discharge  Screening examination for sexually transmitted disease     Discharge Instructions      Take the Flagyl  twice daily with food for the next 7 days unless directed to stop.  Do not drink alcohol on this medication.  We have screened you for some sexually transmitted infections and we will contact you if treatment modification is needed.  Abstain from intercourse until all results have been received.  Return to clinic for any new or urgent symptoms.    ED Prescriptions     Medication Sig Dispense Auth. Provider   metroNIDAZOLE  (FLAGYL ) 500 MG tablet Take 1 tablet (500 mg total) by mouth 2 (two) times daily. 14 tablet Dreama, Faithlynn Deeley  N, FNP      PDMP not reviewed this encounter.   Dreama, Avyay Coger  N, FNP 01/06/24 1450

## 2024-01-07 LAB — CERVICOVAGINAL ANCILLARY ONLY
Bacterial Vaginitis (gardnerella): POSITIVE — AB
Candida Glabrata: NEGATIVE
Candida Vaginitis: POSITIVE — AB
Chlamydia: NEGATIVE
Comment: NEGATIVE
Comment: NEGATIVE
Comment: NEGATIVE
Comment: NEGATIVE
Comment: NEGATIVE
Comment: NORMAL
Neisseria Gonorrhea: POSITIVE — AB
Trichomonas: NEGATIVE

## 2024-01-08 ENCOUNTER — Ambulatory Visit: Payer: Self-pay | Admitting: Emergency Medicine

## 2024-01-08 ENCOUNTER — Ambulatory Visit (HOSPITAL_COMMUNITY)
Admission: EM | Admit: 2024-01-08 | Discharge: 2024-01-08 | Disposition: A | Discharge status: Home / Self Care | Attending: Physician Assistant | Admitting: Physician Assistant

## 2024-01-08 DIAGNOSIS — Z113 Encounter for screening for infections with a predominantly sexual mode of transmission: Secondary | ICD-10-CM

## 2024-01-08 MED ORDER — LIDOCAINE HCL (PF) 1 % IJ SOLN
INTRAMUSCULAR | Status: AC
  Filled 2024-01-08: qty 2

## 2024-01-08 MED ORDER — CEFTRIAXONE SODIUM 500 MG IJ SOLR
INTRAMUSCULAR | Status: AC
  Filled 2024-01-08: qty 500

## 2024-01-08 MED ORDER — FLUCONAZOLE 200 MG PO TABS
ORAL_TABLET | ORAL | 0 refills | Status: DC
Start: 2024-01-08 — End: 2024-02-06

## 2024-01-08 MED ORDER — CEFTRIAXONE SODIUM 500 MG IJ SOLR
500.0000 mg | INTRAMUSCULAR | Status: DC
  Administered 2024-01-08: 500 mg via INTRAMUSCULAR

## 2024-01-08 NOTE — Discharge Instructions (Signed)
 No sexual activity for at least one more week. Any sexual partner(s) need to be treated for gonorrhea as well.

## 2024-01-08 NOTE — ED Triage Notes (Signed)
 Pt presents for Rocephin  injection for treatment of gonorrhea. States she is aware of msg RE: Rx for Diflucan  needed to treat yeast infection.

## 2024-01-15 ENCOUNTER — Encounter (HOSPITAL_COMMUNITY): Payer: Self-pay

## 2024-02-06 ENCOUNTER — Encounter (HOSPITAL_COMMUNITY): Payer: Self-pay

## 2024-02-06 ENCOUNTER — Ambulatory Visit (HOSPITAL_COMMUNITY)
Admission: EM | Admit: 2024-02-06 | Discharge: 2024-02-06 | Disposition: A | Discharge status: Home / Self Care | Attending: Physician Assistant | Admitting: Physician Assistant

## 2024-02-06 DIAGNOSIS — Z113 Encounter for screening for infections with a predominantly sexual mode of transmission: Secondary | ICD-10-CM | POA: Insufficient documentation

## 2024-02-06 DIAGNOSIS — Z202 Contact with and (suspected) exposure to infections with a predominantly sexual mode of transmission: Secondary | ICD-10-CM | POA: Diagnosis present

## 2024-02-06 LAB — POCT URINE PREGNANCY: Preg Test, Ur: NEGATIVE

## 2024-02-06 MED ORDER — LIDOCAINE HCL (PF) 1 % IJ SOLN
INTRAMUSCULAR | Status: AC
  Filled 2024-02-06: qty 2

## 2024-02-06 MED ORDER — CEFTRIAXONE SODIUM 500 MG IJ SOLR
INTRAMUSCULAR | Status: AC
  Filled 2024-02-06: qty 500

## 2024-02-06 MED ORDER — CEFTRIAXONE SODIUM 500 MG IJ SOLR
500.0000 mg | INTRAMUSCULAR | Status: DC
  Administered 2024-02-06: 500 mg via INTRAMUSCULAR

## 2024-02-06 NOTE — ED Provider Notes (Signed)
 [facility]    CSN: [id number] Arrival date & time: 02/06/24  1458      History   Chief Complaint Chief Complaint  Patient presents with   Exposure to STD    HPI Cathy Weber is a 23 y.o. female.   Patient presents today for evaluation of possible exposure to gonorrhea.  Reports that her significant other recently went to the health department and tested positive for gonorrhea and urethritis and they recommended that she be tested and treated as well.  She was last seen 01/06/2024 at which point she was positive for gonorrhea, BV, and yeast and received appropriate treatment.  At that point she was having symptoms and reports that her symptoms resolved.  She is not currently experiencing any symptoms including abdominal pain, pelvic pain, abnormal discharge, fever, nausea, vomiting.  She has no concern for pregnancy.  She is not interested in HIV or syphilis testing.    Past Medical History:  Diagnosis Date   Alpha thalassemia silent carrier 11/19/2018   Medical history non-contributory    Sickle cell trait (HCC) 11/19/2018    Patient Active Problem List   Diagnosis Date Noted   Traumatic type I or II open fracture of shaft of right tibia and fibula 11/03/2023   Sickle cell trait (HCC) 11/19/2018   Alpha thalassemia silent carrier 11/19/2018    Past Surgical History:  Procedure Laterality Date   NO PAST SURGERIES     TIBIA IM NAIL INSERTION Right 11/03/2023   Procedure: INSERTION, INTRAMEDULLARY ROD, TIBIA;  Surgeon: Sharl Selinda Dover, MD;  Location: [facility];  Service: Orthopedics;  Laterality: Right;    OB History     Gravida  3   Para  1   Term  1   Preterm      AB      Living  1      SAB      IAB      Ectopic      Multiple  0   Live Births  1            Home Medications    Prior to Admission medications   Medication Sig Start Date End Date Taking? Authorizing Provider  Blood Pressure Monitoring (BLOOD PRESSURE CUFF)  MISC 1 Device by Does not apply route once a week. For weekly monitoring of blood pressure Patient not taking: Reported on 05/04/2019 11/06/18 02/17/20  Herchel Gloris LABOR, MD    Family History Family History  Problem Relation Age of Onset   Healthy Mother    Healthy Father     Social History Social History   Tobacco Use   Smoking status: Former   Smokeless tobacco: Never  Advertising account planner   Vaping status: Never Used  Substance Use Topics   Alcohol use: Not Currently   Drug use: Not Currently    Types: Marijuana     Allergies   Patient has no known allergies.   Review of Systems Review of Systems  Constitutional:  Negative for activity change, appetite change, fatigue and fever.  Gastrointestinal:  Negative for abdominal pain, nausea and vomiting.  Genitourinary:  Negative for dysuria, flank pain, frequency, genital sores, pelvic pain, urgency, vaginal bleeding, vaginal discharge and vaginal pain.     Physical Exam Triage Vital Signs ED Triage Vitals  Encounter Vitals Group     BP 02/06/24 1559 119/81     Girls Systolic BP Percentile --      Girls Diastolic BP Percentile --  Boys Systolic BP Percentile --      Boys Diastolic BP Percentile --      Pulse Rate 02/06/24 1559 71     Resp 02/06/24 1559 18     Temp 02/06/24 1559 98.1 F (36.7 C)     Temp Source 02/06/24 1559 Oral     SpO2 02/06/24 1559 98 %     Weight 02/06/24 1559 203 lb (92.1 kg)     Height 02/06/24 1559 5' 10.98 (1.803 m)     Head Circumference --      Peak Flow --      Pain Score 02/06/24 1558 0     Pain Loc --      Pain Education --      Exclude from Growth Chart --    No data found.  Updated Vital Signs BP 119/81 (BP Location: Left Arm)   Pulse 71   Temp 98.1 F (36.7 C) (Oral)   Resp 18   Ht 5' 10.98 (1.803 m)   Wt 203 lb (92.1 kg)   LMP 02/02/2024 (Exact Date)   SpO2 98%   Breastfeeding No   BMI 28.33 kg/m   Visual Acuity Right Eye Distance:   Left Eye Distance:    Bilateral Distance:    Right Eye Near:   Left Eye Near:    Bilateral Near:     Physical Exam Vitals reviewed.  Constitutional:      General: She is awake. She is not in acute distress.    Appearance: Normal appearance. She is well-developed. She is not ill-appearing.     Comments: Very pleasant female appears stated age in no acute distress sitting comfortably in exam room  HENT:     Head: Normocephalic and atraumatic.     Mouth/Throat:     Pharynx: Uvula midline. No oropharyngeal exudate or posterior oropharyngeal erythema.  Cardiovascular:     Rate and Rhythm: Normal rate and regular rhythm.     Heart sounds: Normal heart sounds, S1 normal and S2 normal. No murmur heard. Pulmonary:     Effort: Pulmonary effort is normal.     Breath sounds: Normal breath sounds. No wheezing, rhonchi or rales.     Comments: Clear to auscultation bilaterally Abdominal:     General: Bowel sounds are normal.     Palpations: Abdomen is soft.     Tenderness: There is no abdominal tenderness. There is no right CVA tenderness, left CVA tenderness, guarding or rebound.  Psychiatric:        Behavior: Behavior is cooperative.      UC Treatments / Results  Labs (all labs ordered are listed, but only abnormal results are displayed) Labs Reviewed  POCT URINE PREGNANCY  CERVICOVAGINAL ANCILLARY ONLY    EKG   Radiology No results found.  Procedures Procedures (including critical care time)  Medications Ordered in UC Medications  cefTRIAXone (ROCEPHIN) injection 500 mg (has no administration in time range)    Initial Impression / Assessment and Plan / UC Course  I have reviewed the triage vital signs and the nursing notes.  Pertinent labs & imaging results that were available during my care of the patient were reviewed by me and considered in my medical decision making (see chart for details).     Patient is well-appearing, afebrile, nontoxic, nontachycardic.  She was empirically  treated for gonorrhea given known exposure with Rocephin 500 mg in clinic today.  STI swab was collected and we will contact her if there is anything else  that is positive and we need to arrange treatment.  She declined HIV and syphilis testing.  We discussed the importance of abstaining from sexual activity for at least a week after treatment in order to ensure that she is not passing it back and forth between partners and being reexposed.  We discussed importance of safe sex practices.  If she develops any symptoms including vaginal pain, pelvic pain, abnormal discharge she needs to be seen immediately.  Final Clinical Impressions(s) / UC Diagnoses   Final diagnoses:  Exposure to gonorrhea  Screening examination for STI     Discharge Instructions      We are treating you for gonorrhea.  Please monitor your MyChart for additional results.  We will contact you if we need to arrange any additional treatment.  Abstain from sex for 1 week after the injection.  Make sure all partners are tested and treated as well.  Use a condom with each sexual encounter.  If you develop any fever, abdominal pain, pelvic pain, abnormal discharge, genital lesions you should return for reevaluation.     ED Prescriptions   None    PDMP not reviewed this encounter.   Sherrell Rocky POUR, PA-C 02/06/24 1637

## 2024-02-06 NOTE — ED Triage Notes (Signed)
 Patient's partner tested positive for Gonorrhea and NGU. Patient denies any current symptoms. She was informed of his positive results today.

## 2024-02-06 NOTE — ED Notes (Signed)
No answer in waiting area x2 

## 2024-02-06 NOTE — Discharge Instructions (Signed)
 We are treating you for gonorrhea.  Please monitor your MyChart for additional results.  We will contact you if we need to arrange any additional treatment.  Abstain from sex for 1 week after the injection.  Make sure all partners are tested and treated as well.  Use a condom with each sexual encounter.  If you develop any fever, abdominal pain, pelvic pain, abnormal discharge, genital lesions you should return for reevaluation.

## 2024-02-07 ENCOUNTER — Ambulatory Visit: Payer: Self-pay

## 2024-02-07 LAB — CERVICOVAGINAL ANCILLARY ONLY
Chlamydia: NEGATIVE
Comment: NEGATIVE
Comment: NEGATIVE
Comment: NORMAL
Neisseria Gonorrhea: POSITIVE — AB
Trichomonas: NEGATIVE

## 2024-02-21 ENCOUNTER — Ambulatory Visit (HOSPITAL_COMMUNITY)
Admission: EM | Admit: 2024-02-21 | Discharge: 2024-02-21 | Disposition: A | Discharge status: Home / Self Care | Attending: Physician Assistant | Admitting: Physician Assistant

## 2024-02-21 ENCOUNTER — Encounter (HOSPITAL_COMMUNITY): Payer: Self-pay | Admitting: *Deleted

## 2024-02-21 DIAGNOSIS — N898 Other specified noninflammatory disorders of vagina: Secondary | ICD-10-CM | POA: Insufficient documentation

## 2024-02-21 MED ORDER — METRONIDAZOLE 500 MG PO TABS: 500.0000 mg | ORAL_TABLET | Freq: Two times a day (BID) | ORAL | 0 refills | Status: DC

## 2024-02-21 NOTE — ED Provider Notes (Signed)
 [facility]    CSN: [id number] Arrival date & time: 02/21/24  1556      History   Chief Complaint Chief Complaint  Patient presents with   Vaginal Discharge    HPI Cathy Weber is a 23 y.o. female.   Patient presents today with several week history of vaginal discharge.  She was last seen by clinic on 02/06/2024 at which point she tested positive for gonorrhea and received 500 mg Rocephin  during her visit.  At that time, she was on experiencing any significant symptoms including discharge and so BV and yeast testing was not obtained.  She reports that she has since developed vaginal discharge with an associated odor consistent with previous episodes of BV.  She denies any pelvic pain, abdominal pain, fever, nausea, vomiting.  She is confident that she is not pregnant.  She has not tried any over-the-counter medication for symptom management.  She has no concern for additional exposure to STI.    Past Medical History:  Diagnosis Date   Alpha thalassemia silent carrier 11/19/2018   Sickle cell trait (HCC) 11/19/2018    Patient Active Problem List   Diagnosis Date Noted   Traumatic type I or II open fracture of shaft of right tibia and fibula 11/03/2023   Sickle cell trait (HCC) 11/19/2018   Alpha thalassemia silent carrier 11/19/2018    Past Surgical History:  Procedure Laterality Date   NO PAST SURGERIES     TIBIA IM NAIL INSERTION Right 11/03/2023   Procedure: INSERTION, INTRAMEDULLARY ROD, TIBIA;  Surgeon: Sharl Selinda Dover, MD;  Location: [facility];  Service: Orthopedics;  Laterality: Right;    OB History     Gravida  3   Para  1   Term  1   Preterm      AB      Living  1      SAB      IAB      Ectopic      Multiple  0   Live Births  1            Home Medications    Prior to Admission medications   Medication Sig Start Date End Date Taking? Authorizing Provider  metroNIDAZOLE  (FLAGYL ) 500 MG tablet Take 1 tablet (500 mg  total) by mouth 2 (two) times daily. 02/21/24  Yes Yeray Tomas, Rocky POUR, PA-C  Blood Pressure Monitoring (BLOOD PRESSURE CUFF) MISC 1 Device by Does not apply route once a week. For weekly monitoring of blood pressure Patient not taking: Reported on 05/04/2019 11/06/18 02/17/20  Herchel Gloris LABOR, MD    Family History Family History  Problem Relation Age of Onset   Healthy Mother    Healthy Father     Social History Social History   Tobacco Use   Smoking status: Former   Smokeless tobacco: Never  Advertising account planner   Vaping status: Never Used  Substance Use Topics   Alcohol use: Not Currently   Drug use: Not Currently    Types: Marijuana     Allergies   Patient has no known allergies.   Review of Systems Review of Systems  Constitutional:  Negative for activity change, appetite change, fatigue and fever.  Gastrointestinal:  Negative for abdominal pain, diarrhea, nausea and vomiting.  Genitourinary:  Positive for vaginal discharge. Negative for dysuria, frequency, genital sores, pelvic pain, urgency, vaginal bleeding and vaginal pain.     Physical Exam Triage Vital Signs ED Triage Vitals  Encounter Vitals Group  BP 02/21/24 1641 115/76     Girls Systolic BP Percentile --      Girls Diastolic BP Percentile --      Boys Systolic BP Percentile --      Boys Diastolic BP Percentile --      Pulse Rate 02/21/24 1641 60     Resp 02/21/24 1641 18     Temp 02/21/24 1641 98.2 F (36.8 C)     Temp Source 02/21/24 1641 Oral     SpO2 02/21/24 1641 100 %     Weight --      Height --      Head Circumference --      Peak Flow --      Pain Score 02/21/24 1642 0     Pain Loc --      Pain Education --      Exclude from Growth Chart --    No data found.  Updated Vital Signs BP 115/76   Pulse 60   Temp 98.2 F (36.8 C) (Oral)   Resp 18   LMP 02/02/2024 (Exact Date)   SpO2 100%   Visual Acuity Right Eye Distance:   Left Eye Distance:   Bilateral Distance:    Right Eye Near:    Left Eye Near:    Bilateral Near:     Physical Exam Vitals reviewed.  Constitutional:      General: She is awake. She is not in acute distress.    Appearance: Normal appearance. She is well-developed. She is not ill-appearing.     Comments: Very pleasant female presented to no acute distress sitting comfortable in exam room  HENT:     Head: Normocephalic and atraumatic.  Cardiovascular:     Rate and Rhythm: Normal rate and regular rhythm.     Heart sounds: Normal heart sounds, S1 normal and S2 normal. No murmur heard. Pulmonary:     Effort: Pulmonary effort is normal.     Breath sounds: Normal breath sounds. No wheezing, rhonchi or rales.     Comments: Clear to auscultation bilaterally Abdominal:     General: Bowel sounds are normal.     Palpations: Abdomen is soft.     Tenderness: There is no abdominal tenderness. There is no right CVA tenderness, left CVA tenderness, guarding or rebound.     Comments: Benign abdominal exam  Genitourinary:    Comments: Exam deferred Psychiatric:        Behavior: Behavior is cooperative.      UC Treatments / Results  Labs (all labs ordered are listed, but only abnormal results are displayed) Labs Reviewed  CERVICOVAGINAL ANCILLARY ONLY    EKG   Radiology No results found.  Procedures Procedures (including critical care time)  Medications Ordered in UC Medications - No data to display  Initial Impression / Assessment and Plan / UC Course  I have reviewed the triage vital signs and the nursing notes.  Pertinent labs & imaging results that were available during my care of the patient were reviewed by me and considered in my medical decision making (see chart for details).     Patient is well-appearing, afebrile, nontoxic, nontachycardic.  Swab was collected to test for BV versus yeast.  We discussed that given she was just treated for gonorrhea it is likely to come back positive since it has only been a few weeks and so since  she has no specific concerns for STI she elected not to test for gonorrhea, chlamydia, trichomonas as she is  confident that this is BV.  If her swab is negative for BV and she continues to have symptoms we will consider repeat STI panel including mycoplasma genitalium.  She was prescribed metronidazole  twice daily for 1 week.  We discussed that she is to avoid alcohol while on this medication due to associated antibiotic side effects.  We discussed that if she develops any symptoms including pelvic pain, abdominal pain, fever, nausea, vomiting she needs to be seen immediately.  Strict return precautions given.  Excuse note provided.  Final Clinical Impressions(s) / UC Diagnoses   Final diagnoses:  Vaginal discharge  Vaginal odor     Discharge Instructions      We are treating you for bacterial vaginosis.  Take metronidazole  twice daily for 7 days.  Do not drink any alcohol while on this medication as it will cause you to vomit.  We will contact you if any of your other testing is abnormal and we need to arrange additional treatment.  Use hypoallergenic soaps and detergents and wear loosefitting cotton underwear.  If you have any worsening or changing symptoms including abnormal discharge, pelvic pain, abdominal pain, fever, nausea, vomiting you should be reevaluated.      ED Prescriptions     Medication Sig Dispense Auth. Provider   metroNIDAZOLE  (FLAGYL ) 500 MG tablet Take 1 tablet (500 mg total) by mouth 2 (two) times daily. 14 tablet Miri Jose K, PA-C      PDMP not reviewed this encounter.   Sherrell Rocky POUR, PA-C 02/21/24 1709

## 2024-02-21 NOTE — Discharge Instructions (Signed)
 We are treating you for bacterial vaginosis.  Take metronidazole  twice daily for 7 days.  Do not drink any alcohol while on this medication as it will cause you to vomit.  We will contact you if any of your other testing is abnormal and we need to arrange additional treatment.  Use hypoallergenic soaps and detergents and wear loosefitting cotton underwear.  If you have any worsening or changing symptoms including abnormal discharge, pelvic pain, abdominal pain, fever, nausea, vomiting you should be reevaluated.

## 2024-02-21 NOTE — ED Triage Notes (Signed)
 Pt believes she has BV and wishes to be checked. C/O starting with vaginal discharge 2 wks ago, and vaginal odor this week.

## 2024-02-26 ENCOUNTER — Ambulatory Visit (HOSPITAL_COMMUNITY): Payer: Self-pay

## 2024-02-26 LAB — CERVICOVAGINAL ANCILLARY ONLY
Bacterial Vaginitis (gardnerella): POSITIVE — AB
Candida Glabrata: NEGATIVE
Candida Vaginitis: NEGATIVE
Comment: NEGATIVE
Comment: NEGATIVE
Comment: NEGATIVE

## 2024-05-04 ENCOUNTER — Ambulatory Visit: Admitting: Obstetrics

## 2024-05-04 ENCOUNTER — Encounter: Payer: Self-pay | Admitting: Obstetrics

## 2024-05-04 VITALS — BP 107/68 | HR 69 | Ht 70.5 in | Wt 212.5 lb

## 2024-05-04 DIAGNOSIS — Z3009 Encounter for other general counseling and advice on contraception: Secondary | ICD-10-CM

## 2024-05-04 DIAGNOSIS — Z01419 Encounter for gynecological examination (general) (routine) without abnormal findings: Secondary | ICD-10-CM

## 2024-05-04 DIAGNOSIS — E66811 Obesity, class 1: Secondary | ICD-10-CM

## 2024-05-04 DIAGNOSIS — N898 Other specified noninflammatory disorders of vagina: Secondary | ICD-10-CM

## 2024-05-04 NOTE — Addendum Note (Signed)
 Addended by: MARCINE GAINS on: 05/04/2024 12:16 PM   Modules accepted: Orders

## 2024-05-04 NOTE — Progress Notes (Signed)
 Pt presents for annual.  Pt started her period on 11/09 yesterday. Pt states that she is bleeding heavy.

## 2024-05-04 NOTE — Progress Notes (Signed)
 Patient started period today, and is bleeding heavy. Appointment for annual / pap rescheduled.  CARLIN RONAL CENTERS, MD, FACOG Attending Obstetrician & Gynecologist, [facility] for [facility], [facility], [place] 05/04/2024

## 2024-05-19 ENCOUNTER — Ambulatory Visit: Admitting: Obstetrics

## 2024-06-05 ENCOUNTER — Ambulatory Visit: Admitting: Obstetrics

## 2024-07-18 ENCOUNTER — Encounter (HOSPITAL_COMMUNITY): Payer: Self-pay

## 2024-07-18 ENCOUNTER — Ambulatory Visit (HOSPITAL_COMMUNITY)
Admission: EM | Admit: 2024-07-18 | Discharge: 2024-07-18 | Disposition: A | Discharge status: Home / Self Care | Attending: Family Medicine | Admitting: Family Medicine

## 2024-07-18 DIAGNOSIS — Z202 Contact with and (suspected) exposure to infections with a predominantly sexual mode of transmission: Secondary | ICD-10-CM | POA: Insufficient documentation

## 2024-07-18 DIAGNOSIS — N760 Acute vaginitis: Secondary | ICD-10-CM | POA: Diagnosis present

## 2024-07-18 LAB — HIV ANTIBODY (ROUTINE TESTING W REFLEX): HIV Screen 4th Generation wRfx: NONREACTIVE

## 2024-07-18 LAB — POCT URINE PREGNANCY: Preg Test, Ur: NEGATIVE

## 2024-07-18 MED ORDER — METRONIDAZOLE 500 MG PO TABS: 500.0000 mg | ORAL_TABLET | Freq: Two times a day (BID) | ORAL | 0 refills | Status: AC

## 2024-07-18 NOTE — ED Triage Notes (Signed)
 Patient requesting STD testing. Having vaginal discharge ongoing for over a week. Denies any urinary symptoms or any pain.

## 2024-07-18 NOTE — Discharge Instructions (Addendum)
 The pregnancy test was negative  Staff will notify you if there is anything positive on the swab (the swab tests for gonorrhea, chlamydia, trichomonas, bacterial vaginosis, and vaginal yeast) or on the blood work. It can take 2-3 days for the tests to result, depending on the day of the week your test was taken. You will only be notified if there are any positives on the testing; test results will also go to your MyChart if you are signed up for MyChart.   Take metronidazole  500 mg--1 tablet 2 times daily for 7 days.  Avoid drinking alcohol within 72 hours of taking this medication

## 2024-07-18 NOTE — ED Provider Notes (Signed)
 " [facility]    CSN: [id number] Arrival date & time: 07/18/24  1510      History   Chief Complaint Chief Complaint  Patient presents with   Vaginal Discharge    HPI Cathy Weber is a 24 y.o. female.    Vaginal Discharge  Here for vaginal dc, which has been going on for awhile, but worsened in the last week. No dysuria and no abdominal pain.  No fever or vomiting. No itching  She does think that it might be BV which she has had in the past.  NKDA  Last menstrual cycle was December 25 or December 26.   Past Medical History:  Diagnosis Date   Alpha thalassemia silent carrier 11/19/2018   Sickle cell trait 11/19/2018    Patient Active Problem List   Diagnosis Date Noted   Traumatic type I or II open fracture of shaft of right tibia and fibula 11/03/2023   Sickle cell trait 11/19/2018   Alpha thalassemia silent carrier 11/19/2018    Past Surgical History:  Procedure Laterality Date   NO PAST SURGERIES     TIBIA IM NAIL INSERTION Right 11/03/2023   Procedure: INSERTION, INTRAMEDULLARY ROD, TIBIA;  Surgeon: Sharl Selinda Dover, MD;  Location: [facility];  Service: Orthopedics;  Laterality: Right;    OB History     Gravida  3   Para  1   Term  1   Preterm      AB      Living  1      SAB      IAB      Ectopic      Multiple  0   Live Births  1            Home Medications    Prior to Admission medications  Medication Sig Start Date End Date Taking? Authorizing Provider  metroNIDAZOLE  (FLAGYL ) 500 MG tablet Take 1 tablet (500 mg total) by mouth 2 (two) times daily for 7 days. 07/18/24 07/25/24 Yes Vonna Sharlet POUR, MD  Blood Pressure Monitoring (BLOOD PRESSURE CUFF) MISC 1 Device by Does not apply route once a week. For weekly monitoring of blood pressure Patient not taking: Reported on 05/04/2019 11/06/18 02/17/20  Herchel Gloris LABOR, MD    Family History Family History  Problem Relation Age of Onset   Healthy Mother     Healthy Father     Social History Social History[1]   Allergies   Patient has no known allergies.   Review of Systems Review of Systems  Genitourinary:  Positive for vaginal discharge.     Physical Exam Triage Vital Signs ED Triage Vitals  Encounter Vitals Group     BP 07/18/24 1609 111/64     Girls Systolic BP Percentile --      Girls Diastolic BP Percentile --      Boys Systolic BP Percentile --      Boys Diastolic BP Percentile --      Pulse Rate 07/18/24 1609 71     Resp 07/18/24 1609 17     Temp 07/18/24 1609 97.9 F (36.6 C)     Temp Source 07/18/24 1609 Oral     SpO2 07/18/24 1609 98 %     Weight --      Height --      Head Circumference --      Peak Flow --      Pain Score 07/18/24 1608 0     Pain Loc --  Pain Education --      Exclude from Growth Chart --    No data found.  Updated Vital Signs BP 111/64 (BP Location: Left Arm)   Pulse 71   Temp 97.9 F (36.6 C) (Oral)   Resp 17   LMP 06/18/2024 (Approximate)   SpO2 98%   Visual Acuity Right Eye Distance:   Left Eye Distance:   Bilateral Distance:    Right Eye Near:   Left Eye Near:    Bilateral Near:     Physical Exam Vitals reviewed.  Constitutional:      General: She is not in acute distress.    Appearance: She is not ill-appearing, toxic-appearing or diaphoretic.  Skin:    Coloration: Skin is not pale.  Neurological:     Mental Status: She is alert and oriented to person, place, and time.  Psychiatric:        Behavior: Behavior normal.      UC Treatments / Results  Labs (all labs ordered are listed, but only abnormal results are displayed) Labs Reviewed  POCT URINE PREGNANCY - Normal  HIV ANTIBODY (ROUTINE TESTING W REFLEX)  SYPHILIS: RPR W/REFLEX TO RPR TITER AND TREPONEMAL ANTIBODIES, TRADITIONAL SCREENING AND DIAGNOSIS ALGORITHM  CERVICOVAGINAL ANCILLARY ONLY    EKG   Radiology No results found.  Procedures Procedures (including critical care  time)  Medications Ordered in UC Medications - No data to display  Initial Impression / Assessment and Plan / UC Course  I have reviewed the triage vital signs and the nursing notes.  Pertinent labs & imaging results that were available during my care of the patient were reviewed by me and considered in my medical decision making (see chart for details).     UPT is negative.  Blood is drawn for HIV and RPR, and staff will notify them if any of that is positive  Vaginal self swab is done, and we will notify of any positives on that and treat per protocol.  Tests that will be run on that swab were discussed with the patient: Gonorrhea, chlamydia, trichomonas, BV, and yeast.  Flagyl  is sent in to treat empirically for possible BV.  We discussed things he could possibly reduce her frequency of the BV recurrence.  She will also discuss it with her OB/GYN Final Clinical Impressions(s) / UC Diagnoses   Final diagnoses:  Acute vaginitis  Potential exposure to STD     Discharge Instructions      The pregnancy test was negative  Staff will notify you if there is anything positive on the swab (the swab tests for gonorrhea, chlamydia, trichomonas, bacterial vaginosis, and vaginal yeast) or on the blood work. It can take 2-3 days for the tests to result, depending on the day of the week your test was taken. You will only be notified if there are any positives on the testing; test results will also go to your MyChart if you are signed up for MyChart.   Take metronidazole  500 mg--1 tablet 2 times daily for 7 days.  Avoid drinking alcohol within 72 hours of taking this medication       ED Prescriptions     Medication Sig Dispense Auth. Provider   metroNIDAZOLE  (FLAGYL ) 500 MG tablet Take 1 tablet (500 mg total) by mouth 2 (two) times daily for 7 days. 14 tablet Dodge Ator K, MD      PDMP not reviewed this encounter.    [1]  Social History Tobacco Use   Smoking status:  Former   Smokeless tobacco: Never  Vaping Use   Vaping status: Never Used  Substance Use Topics   Alcohol use: Not Currently   Drug use: Not Currently    Types: Marijuana     Vonna Sharlet POUR, MD 07/18/24 1639  "

## 2024-07-19 LAB — SYPHILIS: RPR W/REFLEX TO RPR TITER AND TREPONEMAL ANTIBODIES, TRADITIONAL SCREENING AND DIAGNOSIS ALGORITHM: RPR Ser Ql: NONREACTIVE

## 2024-07-20 ENCOUNTER — Ambulatory Visit (HOSPITAL_BASED_OUTPATIENT_CLINIC_OR_DEPARTMENT_OTHER): Payer: Self-pay

## 2024-07-20 LAB — CERVICOVAGINAL ANCILLARY ONLY
Bacterial Vaginitis (gardnerella): POSITIVE — AB
Candida Glabrata: NEGATIVE
Candida Vaginitis: NEGATIVE
Chlamydia: NEGATIVE
Comment: NEGATIVE
Comment: NEGATIVE
Comment: NEGATIVE
Comment: NEGATIVE
Comment: NEGATIVE
Comment: NORMAL
Neisseria Gonorrhea: NEGATIVE
Trichomonas: NEGATIVE
